# Patient Record
Sex: Male | Born: 1935 | Race: White | Hispanic: No | Marital: Married | Smoking: Never smoker
Health system: Southern US, Community
[De-identification: ages and names within clinical notes are randomized; demographics above are authoritative.]

## PROBLEM LIST (undated history)

## (undated) DIAGNOSIS — N183 Chronic kidney disease, stage 3 unspecified: Secondary | ICD-10-CM

## (undated) DIAGNOSIS — I10 Essential (primary) hypertension: Secondary | ICD-10-CM

## (undated) DIAGNOSIS — M1990 Unspecified osteoarthritis, unspecified site: Secondary | ICD-10-CM

## (undated) DIAGNOSIS — E785 Hyperlipidemia, unspecified: Secondary | ICD-10-CM

## (undated) DIAGNOSIS — E039 Hypothyroidism, unspecified: Secondary | ICD-10-CM

## (undated) DIAGNOSIS — I350 Nonrheumatic aortic (valve) stenosis: Secondary | ICD-10-CM

## (undated) DIAGNOSIS — C801 Malignant (primary) neoplasm, unspecified: Secondary | ICD-10-CM

## (undated) DIAGNOSIS — G4730 Sleep apnea, unspecified: Secondary | ICD-10-CM

## (undated) HISTORY — PX: JOINT REPLACEMENT: SHX530

## (undated) HISTORY — PX: LUMBAR LAMINECTOMY: SHX95

---
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
## 2004-09-01 HISTORY — PX: EYE SURGERY: SHX253

## 2010-09-01 DIAGNOSIS — C801 Malignant (primary) neoplasm, unspecified: Secondary | ICD-10-CM

## 2010-09-01 HISTORY — DX: Malignant (primary) neoplasm, unspecified: C80.1

## 2010-09-01 HISTORY — PX: HERNIA REPAIR: SHX51

## 2016-09-01 DIAGNOSIS — M1990 Unspecified osteoarthritis, unspecified site: Secondary | ICD-10-CM

## 2016-09-01 HISTORY — DX: Unspecified osteoarthritis, unspecified site: M19.90

## 2017-06-10 ENCOUNTER — Encounter
Admission: RE | Admit: 2017-06-10 | Discharge: 2017-06-10 | Disposition: A | Discharge status: Home / Self Care | Payer: Medicare Other | Source: Ambulatory Visit | Attending: Orthopedic Surgery | Admitting: Orthopedic Surgery

## 2017-06-10 DIAGNOSIS — R9431 Abnormal electrocardiogram [ECG] [EKG]: Secondary | ICD-10-CM | POA: Insufficient documentation

## 2017-06-10 DIAGNOSIS — I10 Essential (primary) hypertension: Secondary | ICD-10-CM | POA: Diagnosis not present

## 2017-06-10 DIAGNOSIS — Z01818 Encounter for other preprocedural examination: Secondary | ICD-10-CM | POA: Insufficient documentation

## 2017-06-10 HISTORY — DX: Essential (primary) hypertension: I10

## 2017-06-10 HISTORY — DX: Unspecified osteoarthritis, unspecified site: M19.90

## 2017-06-10 HISTORY — DX: Hypothyroidism, unspecified: E03.9

## 2017-06-10 HISTORY — DX: Sleep apnea, unspecified: G47.30

## 2017-06-10 HISTORY — DX: Malignant (primary) neoplasm, unspecified: C80.1

## 2017-06-10 LAB — SURGICAL PCR SCREEN
MRSA, PCR: NEGATIVE
STAPHYLOCOCCUS AUREUS: NEGATIVE

## 2017-06-10 LAB — COMPREHENSIVE METABOLIC PANEL
ALT: 27 U/L (ref 17–63)
AST: 28 U/L (ref 15–41)
Albumin: 4.2 g/dL (ref 3.5–5.0)
Alkaline Phosphatase: 90 U/L (ref 38–126)
Anion gap: 11 (ref 5–15)
BILIRUBIN TOTAL: 0.7 mg/dL (ref 0.3–1.2)
BUN: 28 mg/dL — AB (ref 6–20)
CO2: 31 mmol/L (ref 22–32)
CREATININE: 1.44 mg/dL — AB (ref 0.61–1.24)
Calcium: 9.7 mg/dL (ref 8.9–10.3)
Chloride: 99 mmol/L — ABNORMAL LOW (ref 101–111)
GFR calc Af Amer: 50 mL/min — ABNORMAL LOW (ref >60)
GFR, EST NON AFRICAN AMERICAN: 43 mL/min — AB (ref >60)
Glucose, Bld: 98 mg/dL (ref 65–99)
Potassium: 3.5 mmol/L (ref 3.5–5.1)
Sodium: 141 mmol/L (ref 135–145)
TOTAL PROTEIN: 7.3 g/dL (ref 6.5–8.1)

## 2017-06-10 LAB — TYPE AND SCREEN
ABO/RH(D): A POS
ANTIBODY SCREEN: NEGATIVE

## 2017-06-10 LAB — CBC
HEMATOCRIT: 38 % — AB (ref 40.0–52.0)
Hemoglobin: 13.2 g/dL (ref 13.0–18.0)
MCH: 30 pg (ref 26.0–34.0)
MCHC: 34.6 g/dL (ref 32.0–36.0)
MCV: 86.5 fL (ref 80.0–100.0)
Platelets: 245 10*3/uL (ref 150–440)
RBC: 4.39 MIL/uL — ABNORMAL LOW (ref 4.40–5.90)
RDW: 14.1 % (ref 11.5–14.5)
WBC: 6.5 10*3/uL (ref 3.8–10.6)

## 2017-06-10 LAB — PROTIME-INR
INR: 0.98
PROTHROMBIN TIME: 12.9 s (ref 11.4–15.2)

## 2017-06-10 LAB — SEDIMENTATION RATE: Sed Rate: 16 mm/hr (ref 0–20)

## 2017-06-10 LAB — URINALYSIS, ROUTINE W REFLEX MICROSCOPIC
BILIRUBIN URINE: NEGATIVE
GLUCOSE, UA: NEGATIVE mg/dL
HGB URINE DIPSTICK: NEGATIVE
Ketones, ur: NEGATIVE mg/dL
Leukocytes, UA: NEGATIVE
Nitrite: NEGATIVE
PH: 6 (ref 5.0–8.0)
Protein, ur: NEGATIVE mg/dL
SPECIFIC GRAVITY, URINE: 1.012 (ref 1.005–1.030)

## 2017-06-10 LAB — APTT: aPTT: 28 seconds (ref 24–36)

## 2017-06-10 LAB — C-REACTIVE PROTEIN

## 2017-06-10 NOTE — Patient Instructions (Signed)
Your procedure is scheduled on: June 24, 2017  Report to [facility], 2nd floor  To find out your arrival time please call [phone] between 1PM - 3PM on June 23, 2017  Remember: Instructions that are not followed completely may result in serious medical risk, up to and including death, or upon the discretion of your surgeon and anesthesiologist your surgery may need to be rescheduled.     _X__ 1. Do not eat food after midnight the night before your procedure.                 No gum chewing or hard candies. You may drink clear liquids up to 2 hours                 before you are scheduled to arrive for your surgery- DO not drink clear                 liquids within 2 hours of the start of your surgery.                 Clear Liquids include:  water, apple juice without pulp, clear carbohydrate                 drink such as Clearfast of Gartorade, Black Coffee or Tea (Do not add                 anything to coffee or tea).     _X__ 2.  No Alcohol for 24 hours before or after surgery.    _      3.  Do Not Smoke or use e-cigarettes For 24 Hours Prior to Your Surgery.                 Do not use any chewable tobacco products for at least 6 hours prior to                 surgery.  ____  4.  Bring all medications with you on the day of surgery if instructed.   __X__  5.  Notify your doctor if there is any change in your medical condition      (cold, fever, infections).     Do not wear jewelry, make-up, hairpins, clips or nail polish. Do not wear lotions, powders, or perfumes. You may wear deodorant. Do not shave 48 hours prior to surgery. Men may shave face and neck. Do not bring valuables to the hospital.    [facility] is not responsible for any belongings or valuables.  Contacts, dentures or bridgework may not be worn into surgery. Leave your suitcase in the car. After surgery it may be brought to your room. For patients admitted to the hospital,  discharge time is determined by your treatment team.   Patients discharged the day of surgery will not be allowed to drive home.   Please read over the following fact sheets that you were given:   [place]   ____ Take these medicines the morning of surgery with A SIP OF WATER:    1.Amlodipine  2. Synthroid  3.   4.  5.  6.  ____ Fleet Enema (as directed)   __X__ Use CHG Soap as directed  ____ Use inhalers on the day of surgery  ____ Stop metformin 2 days prior to surgery    ____ Take 1/2 of usual insulin dose the night before surgery. No insulin the morning  of surgery.   __X__ Stop Coumadin/Plavix/aspirin on June 17, 2017  ___X_ Stop Anti-inflammatories (ibuprofen, motrin, aleve, advil, mobic) on June 17, 2017   __X__ Stop supplements until after surgery.  Stop Fish oil also on June 17, 2017  ____ Bring C-Pap to the hospital.   Continue taking Hydrochlorothiazide, potassium, fiber and multivitamins up until day of surgery but do not take on the day of surgery.

## 2017-06-11 LAB — URINE CULTURE
CULTURE: NO GROWTH
SPECIAL REQUESTS: NORMAL

## 2017-06-11 NOTE — Pre-Procedure Instructions (Signed)
EKG REVIEWED BY DR Rosey Bath AND OK TO PROCEED WITH SURGERY

## 2017-06-23 MED ORDER — CEFAZOLIN SODIUM-DEXTROSE 2-4 GM/100ML-% IV SOLN
2.0000 g | INTRAVENOUS | Status: AC
  Administered 2017-06-24: 2 g via INTRAVENOUS

## 2017-06-24 ENCOUNTER — Encounter: Payer: Self-pay | Admitting: Orthopedic Surgery

## 2017-06-24 ENCOUNTER — Encounter
Admission: RE | Disposition: A | Discharge status: Home Health | Payer: Self-pay | Source: Ambulatory Visit | Attending: Orthopedic Surgery

## 2017-06-24 ENCOUNTER — Inpatient Hospital Stay: Payer: Medicare Other | Admitting: Certified Registered Nurse Anesthetist

## 2017-06-24 ENCOUNTER — Inpatient Hospital Stay: Payer: Medicare Other

## 2017-06-24 ENCOUNTER — Inpatient Hospital Stay
Admission: RE | Admit: 2017-06-24 | Discharge: 2017-06-26 | DRG: 470 | Disposition: A | Discharge status: Home Health | Payer: Medicare Other | Source: Ambulatory Visit | Attending: Orthopedic Surgery | Admitting: Orthopedic Surgery

## 2017-06-24 DIAGNOSIS — Z96649 Presence of unspecified artificial hip joint: Secondary | ICD-10-CM

## 2017-06-24 DIAGNOSIS — Z23 Encounter for immunization: Secondary | ICD-10-CM

## 2017-06-24 DIAGNOSIS — E785 Hyperlipidemia, unspecified: Secondary | ICD-10-CM | POA: Diagnosis present

## 2017-06-24 DIAGNOSIS — I350 Nonrheumatic aortic (valve) stenosis: Secondary | ICD-10-CM | POA: Diagnosis present

## 2017-06-24 DIAGNOSIS — M19042 Primary osteoarthritis, left hand: Secondary | ICD-10-CM | POA: Diagnosis present

## 2017-06-24 DIAGNOSIS — E039 Hypothyroidism, unspecified: Secondary | ICD-10-CM | POA: Diagnosis present

## 2017-06-24 DIAGNOSIS — I129 Hypertensive chronic kidney disease with stage 1 through stage 4 chronic kidney disease, or unspecified chronic kidney disease: Secondary | ICD-10-CM | POA: Diagnosis present

## 2017-06-24 DIAGNOSIS — Z85820 Personal history of malignant melanoma of skin: Secondary | ICD-10-CM | POA: Diagnosis not present

## 2017-06-24 DIAGNOSIS — M25551 Pain in right hip: Secondary | ICD-10-CM | POA: Diagnosis present

## 2017-06-24 DIAGNOSIS — M1611 Unilateral primary osteoarthritis, right hip: Secondary | ICD-10-CM | POA: Diagnosis present

## 2017-06-24 DIAGNOSIS — Z791 Long term (current) use of non-steroidal anti-inflammatories (NSAID): Secondary | ICD-10-CM | POA: Diagnosis not present

## 2017-06-24 DIAGNOSIS — M19041 Primary osteoarthritis, right hand: Secondary | ICD-10-CM | POA: Diagnosis present

## 2017-06-24 DIAGNOSIS — N183 Chronic kidney disease, stage 3 (moderate): Secondary | ICD-10-CM | POA: Diagnosis present

## 2017-06-24 DIAGNOSIS — Z7982 Long term (current) use of aspirin: Secondary | ICD-10-CM

## 2017-06-24 DIAGNOSIS — G4730 Sleep apnea, unspecified: Secondary | ICD-10-CM | POA: Diagnosis present

## 2017-06-24 DIAGNOSIS — Z79899 Other long term (current) drug therapy: Secondary | ICD-10-CM | POA: Diagnosis not present

## 2017-06-24 HISTORY — PX: TOTAL HIP ARTHROPLASTY: SHX124

## 2017-06-24 LAB — ABO/RH: ABO/RH(D): A POS

## 2017-06-24 SURGERY — ARTHROPLASTY, HIP, TOTAL,POSTERIOR APPROACH
Anesthesia: Spinal | Site: Hip | Laterality: Right | Wound class: Clean

## 2017-06-24 MED ORDER — DEXTROSE 5 % IV SOLN
2.0000 g | Freq: Four times a day (QID) | INTRAVENOUS | Status: AC
  Administered 2017-06-24 – 2017-06-25 (×4): 2 g via INTRAVENOUS
  Filled 2017-06-24 (×4): qty 20

## 2017-06-24 MED ORDER — ACETAMINOPHEN 650 MG RE SUPP: 650.0000 mg | RECTAL | Status: DC | PRN

## 2017-06-24 MED ORDER — LACTATED RINGERS IV SOLN
INTRAVENOUS | Status: DC
  Administered 2017-06-24 (×2): via INTRAVENOUS

## 2017-06-24 MED ORDER — ONDANSETRON HCL 4 MG/2ML IJ SOLN: 4.0000 mg | Freq: Once | INTRAMUSCULAR | Status: DC | PRN

## 2017-06-24 MED ORDER — CEFAZOLIN SODIUM-DEXTROSE 2-4 GM/100ML-% IV SOLN
INTRAVENOUS | Status: AC
  Filled 2017-06-24: qty 100

## 2017-06-24 MED ORDER — TRAMADOL HCL 50 MG PO TABS
50.0000 mg | ORAL_TABLET | ORAL | Status: DC | PRN
  Administered 2017-06-24: 50 mg via ORAL
  Filled 2017-06-24: qty 1

## 2017-06-24 MED ORDER — VITAMIN B-12 1000 MCG PO TABS
1000.0000 ug | ORAL_TABLET | Freq: Every day | ORAL | Status: DC
  Administered 2017-06-24 – 2017-06-26 (×3): 1000 ug via ORAL
  Filled 2017-06-24 (×3): qty 1

## 2017-06-24 MED ORDER — DEXAMETHASONE SODIUM PHOSPHATE 4 MG/ML IJ SOLN
INTRAMUSCULAR | Status: DC | PRN
  Administered 2017-06-24: 5 mg via INTRAVENOUS

## 2017-06-24 MED ORDER — GLYCOPYRROLATE 0.2 MG/ML IJ SOLN
INTRAMUSCULAR | Status: DC | PRN
  Administered 2017-06-24: 0.2 mg via INTRAVENOUS

## 2017-06-24 MED ORDER — PROPOFOL 10 MG/ML IV BOLUS
INTRAVENOUS | Status: AC
  Filled 2017-06-24: qty 20

## 2017-06-24 MED ORDER — FLEET ENEMA 7-19 GM/118ML RE ENEM: 1.0000 | ENEMA | Freq: Once | RECTAL | Status: DC | PRN

## 2017-06-24 MED ORDER — ADULT MULTIVITAMIN W/MINERALS CH
1.0000 | ORAL_TABLET | Freq: Every day | ORAL | Status: DC
  Administered 2017-06-24 – 2017-06-26 (×3): 1 via ORAL
  Filled 2017-06-24 (×3): qty 1

## 2017-06-24 MED ORDER — NEOMYCIN-POLYMYXIN B GU 40-200000 IR SOLN
Status: AC
  Filled 2017-06-24: qty 20

## 2017-06-24 MED ORDER — MAGNESIUM HYDROXIDE 400 MG/5ML PO SUSP
30.0000 mL | Freq: Every day | ORAL | Status: DC | PRN
  Administered 2017-06-25: 30 mL via ORAL
  Filled 2017-06-24: qty 30

## 2017-06-24 MED ORDER — POTASSIUM CHLORIDE ER 10 MEQ PO TBCR
10.0000 meq | EXTENDED_RELEASE_TABLET | Freq: Every day | ORAL | Status: DC
  Administered 2017-06-24 – 2017-06-26 (×3): 10 meq via ORAL
  Filled 2017-06-24 (×4): qty 1

## 2017-06-24 MED ORDER — GLYCOPYRROLATE 0.2 MG/ML IJ SOLN
INTRAMUSCULAR | Status: AC
  Filled 2017-06-24: qty 1

## 2017-06-24 MED ORDER — MORPHINE SULFATE (PF) 2 MG/ML IV SOLN: 2.0000 mg | INTRAVENOUS | Status: DC | PRN

## 2017-06-24 MED ORDER — ACETAMINOPHEN 10 MG/ML IV SOLN
1000.0000 mg | Freq: Four times a day (QID) | INTRAVENOUS | Status: AC
  Administered 2017-06-24 (×3): 1000 mg via INTRAVENOUS
  Filled 2017-06-24 (×4): qty 100

## 2017-06-24 MED ORDER — TRANEXAMIC ACID 1000 MG/10ML IV SOLN
1000.0000 mg | Freq: Once | INTRAVENOUS | Status: AC
  Administered 2017-06-24: 1000 mg via INTRAVENOUS
  Filled 2017-06-24: qty 10

## 2017-06-24 MED ORDER — BISACODYL 10 MG RE SUPP
10.0000 mg | Freq: Every day | RECTAL | Status: DC | PRN
  Administered 2017-06-26: 10 mg via RECTAL
  Filled 2017-06-24: qty 1

## 2017-06-24 MED ORDER — PROPOFOL 500 MG/50ML IV EMUL
INTRAVENOUS | Status: DC | PRN
  Administered 2017-06-24: 50 ug/kg/min via INTRAVENOUS

## 2017-06-24 MED ORDER — DIPHENHYDRAMINE HCL 12.5 MG/5ML PO ELIX: 12.5000 mg | ORAL_SOLUTION | ORAL | Status: DC | PRN

## 2017-06-24 MED ORDER — ACETAMINOPHEN 325 MG PO TABS: 650.0000 mg | ORAL_TABLET | ORAL | Status: DC | PRN

## 2017-06-24 MED ORDER — OXYCODONE HCL 5 MG PO TABS
10.0000 mg | ORAL_TABLET | ORAL | Status: DC | PRN
  Administered 2017-06-25: 10 mg via ORAL
  Filled 2017-06-24: qty 2

## 2017-06-24 MED ORDER — CHLORHEXIDINE GLUCONATE 4 % EX LIQD: 60.0000 mL | Freq: Once | CUTANEOUS | Status: DC

## 2017-06-24 MED ORDER — OMEGA-3-ACID ETHYL ESTERS 1 G PO CAPS
1.0000 g | ORAL_CAPSULE | Freq: Every day | ORAL | Status: DC
  Administered 2017-06-24 – 2017-06-26 (×3): 1 g via ORAL
  Filled 2017-06-24 (×3): qty 1

## 2017-06-24 MED ORDER — ONDANSETRON HCL 4 MG PO TABS: 4.0000 mg | ORAL_TABLET | Freq: Four times a day (QID) | ORAL | Status: DC | PRN

## 2017-06-24 MED ORDER — BUPIVACAINE HCL (PF) 0.5 % IJ SOLN
INTRAMUSCULAR | Status: DC | PRN
  Administered 2017-06-24: 3 mL

## 2017-06-24 MED ORDER — SENNOSIDES-DOCUSATE SODIUM 8.6-50 MG PO TABS
1.0000 | ORAL_TABLET | Freq: Two times a day (BID) | ORAL | Status: DC
  Administered 2017-06-24 – 2017-06-26 (×4): 1 via ORAL
  Filled 2017-06-24 (×4): qty 1

## 2017-06-24 MED ORDER — FAMOTIDINE 20 MG PO TABS
20.0000 mg | ORAL_TABLET | Freq: Once | ORAL | Status: AC
  Administered 2017-06-24: 20 mg via ORAL

## 2017-06-24 MED ORDER — CALCIUM POLYCARBOPHIL 625 MG PO TABS
625.0000 mg | ORAL_TABLET | Freq: Every day | ORAL | Status: DC
  Administered 2017-06-24 – 2017-06-26 (×3): 625 mg via ORAL
  Filled 2017-06-24 (×3): qty 1

## 2017-06-24 MED ORDER — PANTOPRAZOLE SODIUM 40 MG PO TBEC
40.0000 mg | DELAYED_RELEASE_TABLET | Freq: Two times a day (BID) | ORAL | Status: DC
  Administered 2017-06-24 – 2017-06-26 (×4): 40 mg via ORAL
  Filled 2017-06-24 (×4): qty 1

## 2017-06-24 MED ORDER — INFLUENZA VAC SPLIT HIGH-DOSE 0.5 ML IM SUSY
0.5000 mL | PREFILLED_SYRINGE | INTRAMUSCULAR | Status: AC
  Administered 2017-06-25: 0.5 mL via INTRAMUSCULAR
  Filled 2017-06-24 (×2): qty 0.5

## 2017-06-24 MED ORDER — SODIUM CHLORIDE 0.9 % IV SOLN
INTRAVENOUS | Status: DC | PRN
  Administered 2017-06-24: 20 ug/min via INTRAVENOUS

## 2017-06-24 MED ORDER — BUPIVACAINE HCL (PF) 0.5 % IJ SOLN
INTRAMUSCULAR | Status: AC
  Filled 2017-06-24: qty 10

## 2017-06-24 MED ORDER — FAMOTIDINE 20 MG PO TABS
ORAL_TABLET | ORAL | Status: AC
  Administered 2017-06-24: 20 mg via ORAL
  Filled 2017-06-24: qty 1

## 2017-06-24 MED ORDER — ONDANSETRON HCL 4 MG/2ML IJ SOLN: 4.0000 mg | Freq: Four times a day (QID) | INTRAMUSCULAR | Status: DC | PRN

## 2017-06-24 MED ORDER — ACETAMINOPHEN 10 MG/ML IV SOLN
INTRAVENOUS | Status: AC
  Filled 2017-06-24: qty 100

## 2017-06-24 MED ORDER — PROPOFOL 10 MG/ML IV BOLUS
INTRAVENOUS | Status: DC | PRN
  Administered 2017-06-24: 8 mg via INTRAVENOUS
  Administered 2017-06-24: 40 mg via INTRAVENOUS

## 2017-06-24 MED ORDER — SODIUM CHLORIDE 0.9 % IV SOLN
INTRAVENOUS | Status: DC
  Administered 2017-06-24 – 2017-06-25 (×3): via INTRAVENOUS

## 2017-06-24 MED ORDER — ALUM & MAG HYDROXIDE-SIMETH 200-200-20 MG/5ML PO SUSP: 30.0000 mL | ORAL | Status: DC | PRN

## 2017-06-24 MED ORDER — OXYCODONE HCL 5 MG PO TABS
5.0000 mg | ORAL_TABLET | ORAL | Status: DC | PRN
  Administered 2017-06-24 – 2017-06-26 (×8): 5 mg via ORAL
  Filled 2017-06-24 (×9): qty 1

## 2017-06-24 MED ORDER — PROPOFOL 500 MG/50ML IV EMUL
INTRAVENOUS | Status: AC
  Filled 2017-06-24: qty 50

## 2017-06-24 MED ORDER — FENTANYL CITRATE (PF) 100 MCG/2ML IJ SOLN: 25.0000 ug | INTRAMUSCULAR | Status: DC | PRN

## 2017-06-24 MED ORDER — FENTANYL CITRATE (PF) 100 MCG/2ML IJ SOLN
INTRAMUSCULAR | Status: DC | PRN
  Administered 2017-06-24: 100 ug via INTRAVENOUS

## 2017-06-24 MED ORDER — FERROUS SULFATE 325 (65 FE) MG PO TABS
325.0000 mg | ORAL_TABLET | Freq: Two times a day (BID) | ORAL | Status: DC
  Administered 2017-06-24 – 2017-06-26 (×4): 325 mg via ORAL
  Filled 2017-06-24 (×4): qty 1

## 2017-06-24 MED ORDER — ENOXAPARIN SODIUM 30 MG/0.3ML ~~LOC~~ SOLN
30.0000 mg | Freq: Two times a day (BID) | SUBCUTANEOUS | Status: DC
  Administered 2017-06-25 – 2017-06-26 (×3): 30 mg via SUBCUTANEOUS
  Filled 2017-06-24 (×3): qty 0.3

## 2017-06-24 MED ORDER — FENTANYL CITRATE (PF) 100 MCG/2ML IJ SOLN
INTRAMUSCULAR | Status: AC
  Filled 2017-06-24: qty 2

## 2017-06-24 MED ORDER — PHENYLEPHRINE HCL 10 MG/ML IJ SOLN
INTRAMUSCULAR | Status: AC
  Filled 2017-06-24: qty 1

## 2017-06-24 MED ORDER — MENTHOL 3 MG MT LOZG: 1.0000 | LOZENGE | OROMUCOSAL | Status: DC | PRN

## 2017-06-24 MED ORDER — LEVOTHYROXINE SODIUM 50 MCG PO TABS
50.0000 ug | ORAL_TABLET | Freq: Every day | ORAL | Status: DC
  Administered 2017-06-25 – 2017-06-26 (×2): 50 ug via ORAL
  Filled 2017-06-24 (×2): qty 1

## 2017-06-24 MED ORDER — METOCLOPRAMIDE HCL 10 MG PO TABS
10.0000 mg | ORAL_TABLET | Freq: Three times a day (TID) | ORAL | Status: AC
  Administered 2017-06-24 – 2017-06-26 (×8): 10 mg via ORAL
  Filled 2017-06-24 (×8): qty 1

## 2017-06-24 MED ORDER — AMLODIPINE BESYLATE 10 MG PO TABS
10.0000 mg | ORAL_TABLET | Freq: Every day | ORAL | Status: DC
  Administered 2017-06-25 – 2017-06-26 (×2): 10 mg via ORAL
  Filled 2017-06-24 (×2): qty 1

## 2017-06-24 MED ORDER — DEXAMETHASONE SODIUM PHOSPHATE 10 MG/ML IJ SOLN
INTRAMUSCULAR | Status: AC
  Filled 2017-06-24: qty 1

## 2017-06-24 MED ORDER — HYDROCHLOROTHIAZIDE 25 MG PO TABS
25.0000 mg | ORAL_TABLET | Freq: Every day | ORAL | Status: DC
  Administered 2017-06-24 – 2017-06-26 (×3): 25 mg via ORAL
  Filled 2017-06-24 (×3): qty 1

## 2017-06-24 MED ORDER — ACETAMINOPHEN 10 MG/ML IV SOLN
INTRAVENOUS | Status: DC | PRN
  Administered 2017-06-24: 1000 mg via INTRAVENOUS

## 2017-06-24 MED ORDER — TRANEXAMIC ACID 1000 MG/10ML IV SOLN
1000.0000 mg | INTRAVENOUS | Status: AC
  Administered 2017-06-24: 1000 mg via INTRAVENOUS
  Filled 2017-06-24: qty 10

## 2017-06-24 MED ORDER — PHENOL 1.4 % MT LIQD: 1.0000 | OROMUCOSAL | Status: DC | PRN

## 2017-06-24 MED ORDER — NEOMYCIN-POLYMYXIN B GU 40-200000 IR SOLN
Status: DC | PRN
  Administered 2017-06-24: 16 mL

## 2017-06-24 SURGICAL SUPPLY — 57 items
BLADE CLIPPER SURG (BLADE) ×3 IMPLANT
BLADE DRUM FLTD (BLADE) ×3 IMPLANT
BLADE SAW 1 (BLADE) ×3 IMPLANT
CANISTER SUCT 1200ML W/VALVE (MISCELLANEOUS) ×3 IMPLANT
CANISTER SUCT 3000ML PPV (MISCELLANEOUS) ×6 IMPLANT
CAPT HIP TOTAL 2 ×3 IMPLANT
CARTRIDGE OIL MAESTRO DRILL (MISCELLANEOUS) ×1 IMPLANT
CATH FOL LEG HOLDER (MISCELLANEOUS) ×3 IMPLANT
CATH TRAY METER 16FR LF (MISCELLANEOUS) ×3 IMPLANT
DIFFUSER MAESTRO (MISCELLANEOUS) ×3 IMPLANT
DRAPE INCISE IOBAN 66X60 STRL (DRAPES) ×3 IMPLANT
DRAPE SHEET LG 3/4 BI-LAMINATE (DRAPES) ×3 IMPLANT
DRSG DERMACEA 8X12 NADH (GAUZE/BANDAGES/DRESSINGS) ×3 IMPLANT
DRSG OPSITE POSTOP 4X12 (GAUZE/BANDAGES/DRESSINGS) ×3 IMPLANT
DRSG OPSITE POSTOP 4X14 (GAUZE/BANDAGES/DRESSINGS) IMPLANT
DRSG TEGADERM 2-3/8X2-3/4 SM (GAUZE/BANDAGES/DRESSINGS) ×3 IMPLANT
DRSG TEGADERM 4X4.75 (GAUZE/BANDAGES/DRESSINGS) ×3 IMPLANT
DURAPREP 26ML APPLICATOR (WOUND CARE) ×6 IMPLANT
ELECT BLADE 6.5 EXT (BLADE) ×3 IMPLANT
ELECT CAUTERY BLADE 6.4 (BLADE) ×3 IMPLANT
EVACUATOR 1/8 PVC DRAIN (DRAIN) ×3 IMPLANT
GLOVE BIOGEL M STRL SZ7.5 (GLOVE) ×6 IMPLANT
GLOVE BIOGEL PI IND STRL 7.0 (GLOVE) ×4 IMPLANT
GLOVE BIOGEL PI IND STRL 9 (GLOVE) ×2 IMPLANT
GLOVE BIOGEL PI INDICATOR 7.0 (GLOVE) ×8
GLOVE BIOGEL PI INDICATOR 9 (GLOVE) ×4
GLOVE INDICATOR 7.0 STRL GRN (GLOVE) ×6 IMPLANT
GLOVE INDICATOR 8.0 STRL GRN (GLOVE) ×3 IMPLANT
GLOVE SURG SYN 9.0  PF PI (GLOVE) ×2
GLOVE SURG SYN 9.0 PF PI (GLOVE) ×1 IMPLANT
GOWN STRL REUS W/ TWL LRG LVL3 (GOWN DISPOSABLE) ×3 IMPLANT
GOWN STRL REUS W/TWL 2XL LVL3 (GOWN DISPOSABLE) ×3 IMPLANT
GOWN STRL REUS W/TWL LRG LVL3 (GOWN DISPOSABLE) ×6
HOOD PEEL AWAY FLYTE STAYCOOL (MISCELLANEOUS) ×6 IMPLANT
KIT RM TURNOVER STRD PROC AR (KITS) ×3 IMPLANT
NDL SAFETY 18GX1.5 (NEEDLE) ×3 IMPLANT
NS IRRIG 500ML POUR BTL (IV SOLUTION) ×3 IMPLANT
OIL CARTRIDGE MAESTRO DRILL (MISCELLANEOUS) ×3
PACK HIP PROSTHESIS (MISCELLANEOUS) ×3 IMPLANT
PIN STEIN THRED 5/32 (Pin) ×3 IMPLANT
PULSAVAC PLUS IRRIG FAN TIP (DISPOSABLE) ×3
SOL .9 NS 3000ML IRR  AL (IV SOLUTION) ×2
SOL .9 NS 3000ML IRR UROMATIC (IV SOLUTION) ×1 IMPLANT
SOL PREP PVP 2OZ (MISCELLANEOUS) ×3
SOLUTION PREP PVP 2OZ (MISCELLANEOUS) ×1 IMPLANT
SPONGE DRAIN TRACH 4X4 STRL 2S (GAUZE/BANDAGES/DRESSINGS) ×3 IMPLANT
STAPLER SKIN PROX 35W (STAPLE) ×3 IMPLANT
SUT ETHIBOND #5 BRAIDED 30INL (SUTURE) ×3 IMPLANT
SUT VIC AB 0 CT1 36 (SUTURE) ×3 IMPLANT
SUT VIC AB 1 CT1 36 (SUTURE) ×6 IMPLANT
SUT VIC AB 2-0 CT1 27 (SUTURE) ×2
SUT VIC AB 2-0 CT1 TAPERPNT 27 (SUTURE) ×1 IMPLANT
SYR 20CC LL (SYRINGE) ×3 IMPLANT
TAPE ADH 3 LX (MISCELLANEOUS) ×3 IMPLANT
TAPE TRANSPORE STRL 2 31045 (GAUZE/BANDAGES/DRESSINGS) ×3 IMPLANT
TIP FAN IRRIG PULSAVAC PLUS (DISPOSABLE) ×1 IMPLANT
TOWEL OR 17X26 4PK STRL BLUE (TOWEL DISPOSABLE) ×3 IMPLANT

## 2017-06-24 NOTE — Anesthesia Preprocedure Evaluation (Signed)
Anesthesia Evaluation  Patient identified by MRN, date of birth, ID band Patient awake    Reviewed: Allergy & Precautions, H&P , NPO status , Patient's Chart, lab work & pertinent test results, reviewed documented beta blocker date and time   History of Anesthesia Complications Negative for: history of anesthetic complications  Airway Mallampati: III  TM Distance: >3 FB Neck ROM: full    Dental  (+) Dental Advidsory Given, Caps, Teeth Intact   Pulmonary neg shortness of breath, sleep apnea , neg COPD, neg recent URI,           Cardiovascular Exercise Tolerance: Good hypertension, (-) angina(-) CAD, (-) Past MI, (-) Cardiac Stents and (-) CABG (-) dysrhythmias + Valvular Problems/Murmurs      Neuro/Psych negative neurological ROS  negative psych ROS   GI/Hepatic negative GI ROS, Neg liver ROS,   Endo/Other  neg diabetesHypothyroidism   Renal/GU negative Renal ROS  negative genitourinary   Musculoskeletal   Abdominal   Peds  Hematology negative hematology ROS (+)   Anesthesia Other Findings Past Medical History: 2018: Arthritis     Comment:  left hip and fingers 2012: Cancer ([place])     Comment:  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 No date: Hypertension No date: Hypothyroidism No date: Sleep apnea     Comment:  no cpap since he has not had a study   Reproductive/Obstetrics negative OB ROS                             Anesthesia Physical Anesthesia Plan  ASA: II  Anesthesia Plan: Spinal   Post-op Pain Management:    Induction:   PONV Risk Score and Plan: 2 and Ondansetron and Dexamethasone  Airway Management Planned: Simple Face Mask  Additional Equipment:   Intra-op Plan:   Post-operative Plan:   Informed Consent: I have reviewed the patients History and Physical, chart, labs and discussed the procedure including the risks, benefits and alternatives for  the proposed anesthesia with the patient or authorized representative who has indicated his/her understanding and acceptance.   Dental Advisory Given  Plan Discussed with: Anesthesiologist, CRNA and Surgeon  Anesthesia Plan Comments:         Anesthesia Quick Evaluation

## 2017-06-24 NOTE — Evaluation (Signed)
Physical Therapy Evaluation Patient Details Name: Peter Grant MRN: [id number] DOB: 1930/12/03 Today's Date: 06/24/2017   History of Present Illness  Pt is an 83 y.o. M s/p R THA with posterior hip precautions on 06/24/17.  Clinical Impression  Prior to hospital admission, pt was independent with all ADLs; active with golf and housework; driving; intermittent SPC use.  Pt lives at home with wife.  Currently pt is min assist for bed mobility; min guard for transfers and gait; generalized strength and balance deficits.  Pt would benefit from skilled PT to address noted impairments and functional limitations (see below for any additional details).  Upon hospital discharge, recommend pt discharge to [place].     Follow Up Recommendations Home health PT    Equipment Recommendations  Rolling walker with 5" wheels    Recommendations for Other Services       Precautions / Restrictions Precautions Precautions: Posterior Hip Precaution Booklet Issued: Yes (comment) Restrictions Weight Bearing Restrictions: Yes RLE Weight Bearing: Weight bearing as tolerated      Mobility  Bed Mobility Overal bed mobility: Needs Assistance Bed Mobility: Supine to Sit     Supine to sit: Min assist     General bed mobility comments: Min assist for LE movement; vc's for sequencing  Transfers Overall transfer level: Needs assistance Equipment used: Rolling walker (2 wheeled) Transfers: Sit to/from Stand Sit to Stand: Min guard         General transfer comment: min guard for safety; vc's for extending RLE to maintian hip precautions, pushing off of bed with arms into standing  Ambulation/Gait Ambulation/Gait assistance: Min guard Ambulation Distance (Feet): 4 Feet Assistive device: Rolling walker (2 wheeled)       General Gait Details: decreased gait speed; short steps, decreased stride length bilaterally  Stairs            Wheelchair Mobility    Modified Rankin (Stroke Patients  Only)       Balance Overall balance assessment: Needs assistance Sitting-balance support: Feet supported Sitting balance-Leahy Scale: Fair Sitting balance - Comments: able to sit at EOB, hands in lap, feet supported without LOB   Standing balance support: Bilateral upper extremity supported Standing balance-Leahy Scale: Fair Standing balance comment: RW for BUE support in standing, gait                             Pertinent Vitals/Pain Pain Assessment: 0-10 Pain Score: 3  Pain Location: R hip Pain Descriptors / Indicators: Discomfort;Guarding;Sore Pain Intervention(s): Limited activity within patient's tolerance;Monitored during session;Ice applied    Home Living Family/patient expects to be discharged to:: Private residence Living Arrangements: Spouse/significant other Available Help at Discharge: Family Type of Home: House Home Access: Stairs to enter Entrance Stairs-Rails: Left Entrance Stairs-Number of Steps: 3 Home Layout: Two level (needs are all on first floor) Home Equipment: Cane - single point;Shower seat - built in;Grab bars - toilet;Grab bars - tub/shower      Prior Function Level of Independence: Independent         Comments: Independent with all ADLs; driving; intermittent SPC use, pt reports wife "yells" at him to use [facility] because it appears he limps while walking; active with golf, yard work and other projects around home     Hand Dominance        Extremity/Trunk Assessment   Upper Extremity Assessment Upper Extremity Assessment: Overall WFL for tasks assessed    Lower Extremity Assessment Lower Extremity  Assessment: Generalized weakness (LLE WFL; R hip flexion, knee flexion, extension, DF/PF at least 3/5)    Cervical / Trunk Assessment Cervical / Trunk Assessment: Normal  Communication   Communication: No difficulties  Cognition Arousal/Alertness: Awake/alert Behavior During Therapy: WFL for tasks assessed/performed Overall  Cognitive Status: Within Functional Limits for tasks assessed                                        General Comments      Exercises Total Joint Exercises Ankle Circles/Pumps: AROM;Both;10 reps;Supine Quad Sets: AROM;Strengthening;Both;10 reps;Supine Gluteal Sets: Strengthening;Both;10 reps;Supine Towel Squeeze: Strengthening;AROM;Both;10 reps;Supine Short Arc Quad: AROM;Both;10 reps;Supine Heel Slides: AROM;Left;AAROM;Right;10 reps;Supine Hip ABduction/ADduction: AROM;Left;AAROM;Right;10 reps;Supine   Assessment/Plan    PT Assessment Patient needs continued PT services  PT Problem List Decreased strength;Decreased range of motion;Decreased activity tolerance;Decreased balance;Decreased mobility;Pain       PT Treatment Interventions DME instruction;Gait training;Stair training;Functional mobility training;Therapeutic activities;Therapeutic exercise;Balance training;Patient/family education    PT Goals (Current goals can be found in the Care Plan section)  Acute Rehab PT Goals Patient Stated Goal: to go home PT Goal Formulation: With patient Time For Goal Achievement: 07/08/17 Potential to Achieve Goals: Good    Frequency BID   Barriers to discharge        Co-evaluation               AM-PAC PT "6 Clicks" Daily Activity  Outcome Measure Difficulty turning over in bed (including adjusting bedclothes, sheets and blankets)?: A Little Difficulty moving from lying on back to sitting on the side of the bed? : Unable Difficulty sitting down on and standing up from a chair with arms (e.g., wheelchair, bedside commode, etc,.)?: A Lot Help needed moving to and from a bed to chair (including a wheelchair)?: A Little Help needed walking in hospital room?: A Little Help needed climbing 3-5 steps with a railing? : A Lot 6 Click Score: 14    End of Session Equipment Utilized During Treatment: Gait belt Activity Tolerance: Patient tolerated treatment  well Patient left: in chair;with call bell/phone within reach;with chair alarm set;with SCD's reapplied (heels elevated, pillow between legs) Nurse Communication: Mobility status PT Visit Diagnosis: Unsteadiness on feet (R26.81);Other abnormalities of gait and mobility (R26.89);Muscle weakness (generalized) (M62.81);Pain Pain - Right/Left: Right Pain - part of body: Hip    Time: 7846-9629 PT Time Calculation (min) (ACUTE ONLY): 34 min   Charges:         PT G CodesWetzel Bjornstad, SPT 06/24/2017, 4:08 PM

## 2017-06-24 NOTE — Anesthesia Post-op Follow-up Note (Signed)
Anesthesia QCDR form completed.        

## 2017-06-24 NOTE — Anesthesia Procedure Notes (Signed)
Spinal  Patient location during procedure: OR Start time: 06/24/2017 7:28 AM End time: 06/24/2017 7:37 AM Staffing Anesthesiologist: Martha Clan Resident/CRNA: Rosaria Ferries, Dekendrick Uzelac Performed: anesthesiologist  Preanesthetic Checklist Completed: patient identified, site marked, surgical consent, pre-op evaluation, timeout performed, IV checked, risks and benefits discussed and monitors and equipment checked Spinal Block Patient position: sitting Prep: ChloraPrep Patient monitoring: heart rate, continuous pulse ox, blood pressure and cardiac monitor Approach: midline Location: L3-4 Injection technique: single-shot Needle Needle type: Whitacre and Introducer  Needle gauge: 24 G Needle length: 9 cm Assessment Sensory level: T10 Additional Notes Negative paresthesia. Negative blood return. Positive free-flowing CSF. Expiration date of kit checked and confirmed. Patient tolerated procedure well, without complications.

## 2017-06-24 NOTE — Transfer of Care (Signed)
Immediate Anesthesia Transfer of Care Note  Patient: Peter Grant  Procedure(s) Performed: TOTAL HIP ARTHROPLASTY (Right Hip)  Patient Location: PACU  Anesthesia Type:Spinal  Level of Consciousness: awake and patient cooperative  Airway & Oxygen Therapy: Patient Spontanous Breathing and Patient connected to nasal cannula oxygen  Post-op Assessment: Report given to RN and Post -op Vital signs reviewed and stable  Post vital signs: Reviewed and stable  Last Vitals:  Vitals:   06/24/17 0618  BP: (!) 158/79  Pulse: 88  Resp: 15  Temp: 36.5 C  SpO2: 99%    Last Pain:  Vitals:   06/24/17 0618  TempSrc: Tympanic  PainSc: 6          Complications: No apparent anesthesia complications

## 2017-06-24 NOTE — Discharge Instructions (Signed)
Instructions after Total Hip Replacement ° ° °  Salvatrice Morandi P. Kaleb Linquist, Jr., M.D.    ° Dept. of Orthopaedics & Sports Medicine ° [facility] ° [address] ° [place], [place]  [address] ° Phone: [phone]   Fax: [phone] ° °  °DIET: °• Drink plenty of non-alcoholic fluids. °• Resume your normal diet. Include foods high in fiber. ° °ACTIVITY:  °• You may use crutches or a walker with weight-bearing as tolerated, unless instructed otherwise. °• You may be weaned off of the walker or crutches by your Physical Therapist.  °• Do NOT reach below the level of your knees or cross your legs until allowed.    °• Continue doing gentle exercises. Exercising will reduce the pain and swelling, increase motion, and prevent muscle weakness.   °• Please continue to use the TED compression stockings for 6 weeks. You may remove the stockings at night, but should reapply them in the morning. °• Do not drive or operate any equipment until instructed. ° °WOUND CARE:  °• Continue to use ice packs periodically to reduce pain and swelling. °• Keep the incision clean and dry. °• You may bathe or shower after the staples are removed at the first office visit following surgery. ° °MEDICATIONS: °• You may resume your regular medications. °• Please take the pain medication as prescribed on the medication. °• Do not take pain medication on an empty stomach. °• You have been given a prescription for a blood thinner to prevent blood clots. Please take the medication as instructed. (NOTE: After completing a 2 week course of Lovenox, take one Enteric-coated aspirin once a day.) °• Pain medications and iron supplements can cause constipation. Use a stool softener (Senokot or Colace) on a daily basis and a laxative (dulcolax or miralax) as needed. °• Do not drive or drink alcoholic beverages when taking pain medications. ° °CALL THE OFFICE FOR: °• Temperature above 101 degrees °• Excessive bleeding or drainage on the dressing. °• Excessive  swelling, coldness, or paleness of the toes. °• Persistent nausea and vomiting. ° °FOLLOW-UP:  °• You should have an appointment to return to the office in 6 weeks after surgery. °• Arrangements have been made for continuation of Physical Therapy (either home therapy or outpatient therapy). °  °

## 2017-06-24 NOTE — NC FL2 (Signed)
[place] LEVEL OF CARE SCREENING TOOL     IDENTIFICATION  Patient Name: Peter Grant Birthdate: 1930/12/19 Sex: male Admission Date (Current Location): 06/24/2017  [place] and [place] Number:  Engineering geologist and Address:  [facility], [address], [place], [place] [address]      Provider Number: [id number]  Attending Physician Name and Address:  Dereck Leep, MD  Relative Name and Phone Number:       Current Level of Care: Hospital Recommended Level of Care: [place] Prior Approval Number:    Date Approved/Denied:   PASRR Number:  [id number])  Discharge Plan: SNF    Current Diagnoses: Patient Active Problem List   Diagnosis Date Noted  . Status post total replacement of hip 06/24/2017    Orientation RESPIRATION BLADDER Height & Weight     Self, Time, Situation, Place  Normal Continent Weight: 175 lb (79.4 kg) Height:  5\' 8"  (172.7 cm)  BEHAVIORAL SYMPTOMS/MOOD NEUROLOGICAL BOWEL NUTRITION STATUS      Continent Diet (Regular Diet )  AMBULATORY STATUS COMMUNICATION OF NEEDS Skin   Extensive Assist Verbally Surgical wounds (Incision: Right Hip. )                       Personal Care Assistance Level of Assistance  Bathing, Feeding, Dressing Bathing Assistance: Limited assistance Feeding assistance: Independent Dressing Assistance: Limited assistance     Functional Limitations Info  Sight, Hearing, Speech Sight Info: Adequate Hearing Info: Adequate Speech Info: Adequate    SPECIAL CARE FACTORS FREQUENCY  PT (By licensed PT), OT (By licensed OT)     PT Frequency:  (5) OT Frequency:  (5)            Contractures      Additional Factors Info  Code Status, Allergies Code Status Info:  (Full Code. ) Allergies Info:  (no known allergies. )           Current Medications (06/24/2017):  This is the current hospital active medication list Current Facility-Administered  Medications  Medication Dose Route Frequency Provider Last Rate Last Dose  . 0.9 %  sodium chloride infusion   Intravenous Continuous Hooten, Laurice Record, MD 100 mL/hr at 06/24/17 1251    . acetaminophen (OFIRMEV) IV 1,000 mg  1,000 mg Intravenous Q6H Hooten, Laurice Record, MD   Stopped at 06/24/17 1306  . acetaminophen (TYLENOL) tablet 650 mg  650 mg Oral Q4H PRN Hooten, Laurice Record, MD       Or  . acetaminophen (TYLENOL) suppository 650 mg  650 mg Rectal Q4H PRN Hooten, Laurice Record, MD      . alum & mag hydroxide-simeth (MAALOX/MYLANTA) 200-200-20 MG/5ML suspension 30 mL  30 mL Oral Q4H PRN Hooten, Laurice Record, MD      . Derrill Memo ON 06/25/2017] amLODipine (NORVASC) tablet 10 mg  10 mg Oral Daily Hooten, Laurice Record, MD      . bisacodyl (DULCOLAX) suppository 10 mg  10 mg Rectal Daily PRN Hooten, Laurice Record, MD      . ceFAZolin (ANCEF) 2 g in dextrose 5 % 100 mL IVPB  2 g Intravenous Q6H Dereck Leep, MD 240 mL/hr at 06/24/17 1509 2 g at 06/24/17 1509  . diphenhydrAMINE (BENADRYL) 12.5 MG/5ML elixir 12.5-25 mg  12.5-25 mg Oral Q4H PRN Hooten, Laurice Record, MD      . Derrill Memo ON 06/25/2017] enoxaparin (LOVENOX) injection 30 mg  30 mg Subcutaneous Q12H Hooten, Laurice Record,  MD      . ferrous sulfate tablet 325 mg  325 mg Oral BID WC Hooten, Laurice Record, MD      . hydrochlorothiazide (HYDRODIURIL) tablet 25 mg  25 mg Oral Daily Hooten, Laurice Record, MD      . Derrill Memo ON 06/25/2017] Influenza vac split quadrivalent PF (FLUZONE HIGH-DOSE) injection 0.5 mL  0.5 mL Intramuscular Tomorrow-1000 Hooten, Laurice Record, MD      . Derrill Memo ON 06/25/2017] levothyroxine (SYNTHROID, LEVOTHROID) tablet 50 mcg  50 mcg Oral QAC breakfast Hooten, Laurice Record, MD      . magnesium hydroxide (MILK OF MAGNESIA) suspension 30 mL  30 mL Oral Daily PRN Hooten, Laurice Record, MD      . menthol-cetylpyridinium (CEPACOL) lozenge 3 mg  1 lozenge Oral PRN Hooten, Laurice Record, MD       Or  . phenol (CHLORASEPTIC) mouth spray 1 spray  1 spray Mouth/Throat PRN Hooten, Laurice Record, MD      .  metoCLOPramide (REGLAN) tablet 10 mg  10 mg Oral TID AC & HS Hooten, Laurice Record, MD   10 mg at 06/24/17 1253  . morphine 2 MG/ML injection 2 mg  2 mg Intravenous Q2H PRN Hooten, Laurice Record, MD      . multivitamin with minerals tablet 1 tablet  1 tablet Oral Daily Hooten, Laurice Record, MD      . omega-3 acid ethyl esters (LOVAZA) capsule 1 g  1 g Oral Daily Hooten, Laurice Record, MD      . ondansetron (ZOFRAN) tablet 4 mg  4 mg Oral Q6H PRN Hooten, Laurice Record, MD       Or  . ondansetron (ZOFRAN) injection 4 mg  4 mg Intravenous Q6H PRN Hooten, Laurice Record, MD      . oxyCODONE (Oxy IR/ROXICODONE) immediate release tablet 10 mg  10 mg Oral Q3H PRN Hooten, Laurice Record, MD      . oxyCODONE (Oxy IR/ROXICODONE) immediate release tablet 5 mg  5 mg Oral Q3H PRN Hooten, Laurice Record, MD   5 mg at 06/24/17 1353  . pantoprazole (PROTONIX) EC tablet 40 mg  40 mg Oral BID Hooten, Laurice Record, MD      . polycarbophil (FIBERCON) tablet 625 mg  625 mg Oral Daily Hooten, Laurice Record, MD      . potassium chloride (K-DUR) CR tablet 10 mEq  10 mEq Oral Daily Hooten, Laurice Record, MD      . senna-docusate (Senokot-S) tablet 1 tablet  1 tablet Oral BID Hooten, Laurice Record, MD      . sodium phosphate (FLEET) 7-19 GM/118ML enema 1 enema  1 enema Rectal Once PRN Hooten, Laurice Record, MD      . traMADol Veatrice Bourbon) tablet 50-100 mg  50-100 mg Oral Q4H PRN Hooten, Laurice Record, MD      . vitamin B-12 (CYANOCOBALAMIN) tablet 1,000 mcg  1,000 mcg Oral Daily Hooten, Laurice Record, MD         Discharge Medications: Please see discharge summary for a list of discharge medications.  Relevant Imaging Results:  Relevant Lab Results:   Additional Information  (SSN: [id number])  Tollie Canada, Veronia Beets, LCSW

## 2017-06-24 NOTE — H&P (Signed)
The patient has been re-examined, and the chart reviewed, and there have been no interval changes to the documented history and physical.    The risks, benefits, and alternatives have been discussed at length. The patient expressed understanding of the risks benefits and agreed with plans for surgical intervention.  Britt Petroni P. Couper Juncaj, Jr. M.D.    

## 2017-06-24 NOTE — Op Note (Signed)
OPERATIVE NOTE  DATE OF SURGERY:  06/24/2017  PATIENT NAME:  Peter Grant   DOB: 1930/12/23  MRN: [id number]  PRE-OPERATIVE DIAGNOSIS: Degenerative arthrosis of the right hip, primary  POST-OPERATIVE DIAGNOSIS:  Same  PROCEDURE:  Right total hip arthroplasty  SURGEON:  Marciano Sequin. M.D.  ASSISTANT:  Vance Peper, PA (present and scrubbed throughout the case, critical for assistance with exposure, retraction, instrumentation, and closure)  ANESTHESIA: spinal  ESTIMATED BLOOD LOSS: 50 mL  FLUIDS REPLACED: 1200 mL of crystalloid  DRAINS: 2 medium drains to a Hemovac reservoir  IMPLANTS UTILIZED: DePuy 13.5 mm large stature AML femoral stem, 52 mm OD Pinnacle 100 acetabular component, neutral Pinnacle Marathon polyethylene insert, and a 36 mm M-SPEC +1.5 mm hip ball  INDICATIONS FOR SURGERY: Peter Grant is a 83 y.o. year old male with a long history of progressive hip and groin  pain. X-rays demonstrated severe degenerative changes. The patient had not seen any significant improvement despite conservative nonsurgical intervention. After discussion of the risks and benefits of surgical intervention, the patient expressed understanding of the risks benefits and agree with plans for total hip arthroplasty.   The risks, benefits, and alternatives were discussed at length including but not limited to the risks of infection, bleeding, nerve injury, stiffness, blood clots, the need for revision surgery, limb length inequality, dislocation, cardiopulmonary complications, among others, and they were willing to proceed.  PROCEDURE IN DETAIL: The patient was brought into the operating room and, after adequate spinal anesthesia was achieved, the patient was placed in a left lateral decubitus position. Axillary roll was placed and all bony prominences were well-padded. The patient's right hip was cleaned and prepped with alcohol and DuraPrep and draped in the usual sterile fashion. A "timeout" was  performed as per usual protocol. A lateral curvilinear incision was made gently curving towards the posterior superior iliac spine. The IT band was incised in line with the skin incision and the fibers of the gluteus maximus were split in line. The piriformis tendon was identified, skeletonized, and incised at its insertion to the proximal femur and reflected posteriorly. A T type posterior capsulotomy was performed. Prior to dislocation of the femoral head, a threaded Steinmann pin was inserted through a separate stab incision into the pelvis superior to the acetabulum and bent in the form of a stylus so as to assess limb length and hip offset throughout the procedure. The femoral head was then dislocated posteriorly. Inspection of the femoral head demonstrated severe degenerative changes with full-thickness loss of articular cartilage. The femoral neck cut was performed using an oscillating saw. The anterior capsule was elevated off of the femoral neck using a periosteal elevator. Attention was then directed to the acetabulum. The remnant of the labrum was excised using electrocautery. Inspection of the acetabulum also demonstrated significant degenerative changes. The acetabulum was reamed in sequential fashion up to a 51 mm diameter. Good punctate bleeding bone was encountered. A 52 mm Pinnacle 100 acetabular component was positioned and impacted into place. Good scratch fit was appreciated. A neutral polyethylene trial was inserted.  Attention was then directed to the proximal femur. A hole for reaming of the proximal femoral canal was created using a high-speed burr. The femoral canal was reamed in sequential fashion up to a 13 mm diameter. This allowed for approximately 7 cm of scratch fit. It was thus elected to ream up to a 13.5 mm diameter to allow for a line to line fit. Serial broaches were inserted up  to a 13.5 mm large stature femoral broach. Calcar region was planed and a trial reduction was  performed using a 36 mm hip ball with a +1.5 mm neck length. The limb length was felt to be somewhat long so the broach was countersunk and additional bone was removed from the calcar region. Trial components were reinserted and the hip was reduced with the 36 mm hip ball with a +1.5 mm neck length. Good equalization of limb lengths and hip offset was appreciated and excellent stability was noted both anteriorly and posteriorly. Trial components were removed. The acetabular shell was irrigated with copious amounts of normal saline with antibiotic solution and suctioned dry. A neutral Pinnacle Marathon polyethylene insert was positioned and impacted into place. Next, a 13.5 mm large stature AML femoral stem was positioned and impacted into place. Excellent scratch fit was appreciated. A trial reduction was again performed with a 36 mm hip ball with a +1.5 mm neck length. Again, good equalization of limb lengths was appreciated and excellent stability appreciated both anteriorly and posteriorly. The hip was then dislocated and the trial hip ball was removed. The Morse taper was cleaned and dried. A 36 mm M-SPEC hip ball with a was 1.5 mm neck length was placed on the trunnion and impacted into place. The hip was then reduced and placed through range of motion. Excellent stability was appreciated both anteriorly and posteriorly.  The wound was irrigated with copious amounts of normal saline with antibiotic solution and suctioned dry. Good hemostasis was appreciated. The posterior capsulotomy was repaired using #5 Ethibond. Piriformis tendon was reapproximated to the undersurface of the gluteus medius tendon using #5 Ethibond. Two medium drains were placed in the wound bed and brought out through separate stab incisions to be attached to a Hemovac reservoir. The IT band was reapproximated using interrupted sutures of #1 Vicryl. Subcutaneous tissue was approximated using first #0 Vicryl followed by #2-0 Vicryl. The  skin was closed with skin staples.  The patient tolerated the procedure well and was transported to the recovery room in stable condition.   Marciano Sequin., M.D.

## 2017-06-25 ENCOUNTER — Encounter: Payer: Self-pay | Admitting: Orthopedic Surgery

## 2017-06-25 LAB — BASIC METABOLIC PANEL
ANION GAP: 7 (ref 5–15)
BUN: 25 mg/dL — ABNORMAL HIGH (ref 6–20)
CHLORIDE: 103 mmol/L (ref 101–111)
CO2: 27 mmol/L (ref 22–32)
Calcium: 8.3 mg/dL — ABNORMAL LOW (ref 8.9–10.3)
Creatinine, Ser: 1.48 mg/dL — ABNORMAL HIGH (ref 0.61–1.24)
GFR calc non Af Amer: 42 mL/min — ABNORMAL LOW (ref >60)
GFR, EST AFRICAN AMERICAN: 48 mL/min — AB (ref >60)
GLUCOSE: 127 mg/dL — AB (ref 65–99)
POTASSIUM: 3.5 mmol/L (ref 3.5–5.1)
Sodium: 137 mmol/L (ref 135–145)

## 2017-06-25 LAB — CBC
HEMATOCRIT: 32.9 % — AB (ref 40.0–52.0)
HEMOGLOBIN: 11.2 g/dL — AB (ref 13.0–18.0)
MCH: 29.8 pg (ref 26.0–34.0)
MCHC: 34 g/dL (ref 32.0–36.0)
MCV: 87.5 fL (ref 80.0–100.0)
Platelets: 199 10*3/uL (ref 150–440)
RBC: 3.76 MIL/uL — AB (ref 4.40–5.90)
RDW: 14.1 % (ref 11.5–14.5)
WBC: 11.1 10*3/uL — AB (ref 3.8–10.6)

## 2017-06-25 MED ORDER — TRAMADOL HCL 50 MG PO TABS: 50.0000 mg | ORAL_TABLET | ORAL | 0 refills | Status: DC | PRN

## 2017-06-25 MED ORDER — OXYCODONE HCL 10 MG PO TABS: 10.0000 mg | ORAL_TABLET | ORAL | 0 refills | Status: DC | PRN

## 2017-06-25 NOTE — Anesthesia Postprocedure Evaluation (Signed)
Anesthesia Post Note  Patient: Peter Grant  Procedure(s) Performed: TOTAL HIP ARTHROPLASTY (Right Hip)  Patient location during evaluation: Nursing Unit Anesthesia Type: Spinal Level of consciousness: awake, awake and alert and oriented Pain management: pain level controlled Vital Signs Assessment: post-procedure vital signs reviewed and stable Respiratory status: spontaneous breathing Cardiovascular status: blood pressure returned to baseline Postop Assessment: no headache, no backache, no apparent nausea or vomiting and adequate PO intake Anesthetic complications: no     Last Vitals:  Vitals:   06/25/17 0457 06/25/17 0740  BP: 115/60 140/71  Pulse: 67 74  Resp: 19 18  Temp: 36.6 C 36.8 C  SpO2: 93% 93%    Last Pain:  Vitals:   06/25/17 0740  TempSrc: Oral  PainSc:                  [name]

## 2017-06-25 NOTE — Progress Notes (Signed)
Milk of mag administered

## 2017-06-25 NOTE — Progress Notes (Signed)
Clinical Social Worker (CSW) received SNF consult. PT is recommending home health. RN case manager aware of above. Please reconsult if future social work needs arise. CSW signing off.   Adley Mazurowski, LCSW [phone] 

## 2017-06-25 NOTE — Discharge Summary (Signed)
Physician Discharge Summary  Patient ID: Peter Grant MRN: [id number] DOB/AGE: [birth year] 83 y.o.  Admit date: 06/24/2017 Discharge date: 06/26/2017  Admission Diagnoses:  PRIMARY OSTEOARTHRITIS OF RIGHT HIP   Discharge Diagnoses: Patient Active Problem List   Diagnosis Date Noted  . Status post total replacement of hip 06/24/2017    Past Medical History:  Diagnosis Date  . Arthritis 2018   left hip and fingers  . Cancer ([place]) 2012   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  . Hypertension   . Hypothyroidism   . Sleep apnea    no cpap since he has not had a study     Transfusion: None   Consultants (if any):   Discharged Condition: Improved  Hospital Course: Kaelon Weekes is an 83 y.o. male who was admitted 06/24/2017 with a diagnosis of degenerative arthrosis right hip and went to the operating room on 06/24/2017 and underwent the above named procedures.    Surgeries:Procedure(s): TOTAL HIP ARTHROPLASTY on 06/24/2017  PRE-OPERATIVE DIAGNOSIS: Degenerative arthrosis of the right hip, primary  POST-OPERATIVE DIAGNOSIS:  Same  PROCEDURE:  Right total hip arthroplasty  SURGEON:  Marciano Sequin. M.D.  ASSISTANT:  Vance Peper, PA (present and scrubbed throughout the case, critical for assistance with exposure, retraction, instrumentation, and closure)  ANESTHESIA: spinal  ESTIMATED BLOOD LOSS: 50 mL  FLUIDS REPLACED: 1200 mL of crystalloid  DRAINS: 2 medium drains to a Hemovac reservoir  IMPLANTS UTILIZED: DePuy 13.5 mm large stature AML femoral stem, 52 mm OD Pinnacle 100 acetabular component, neutral Pinnacle Marathon polyethylene insert, and a 36 mm M-SPEC +1.5 mm hip ball  INDICATIONS FOR SURGERY: Peter Grant is a 83 y.o. year old male with a long history of progressive hip and groin  pain. X-rays demonstrated severe degenerative changes. The patient had not seen any significant improvement despite conservative nonsurgical  intervention. After discussion of the risks and benefits of surgical intervention, the patient expressed understanding of the risks benefits and agree with plans for total hip arthroplasty.   The risks, benefits, and alternatives were discussed at length including but not limited to the risks of infection, bleeding, nerve injury, stiffness, blood clots, the need for revision surgery, limb length inequality, dislocation, cardiopulmonary complications, among others, and they were willing to proceed.  Patient tolerated the surgery well. No complications .Patient was taken to PACU where she was stabilized and then transferred to the orthopedic floor.  Patient started on Lovenox 30 mg q 12 hrs. Foot pumps applied bilaterally at 80 mm hgb. Heels elevated off bed with rolled towels. No evidence of DVT. Calves non tender. Negative Homan. Physical therapy started on day #1 for gait training and transfer with OT starting on  day #1 for ADL and assisted devices. Patient has done well with therapy. Ambulated greater than 200 feet upon being discharged. Was able to ascend and descend 4 steps safely and independently  Patient's IV And Foley were discontinued on day #1 with Hemovac being discontinued on day #2. Dressing was changed on day 2 prior to patient being discharged   He was given perioperative antibiotics:  Anti-infectives    Start     Dose/Rate Route Frequency Ordered Stop   06/24/17 1400  ceFAZolin (ANCEF) 2 g in dextrose 5 % 100 mL IVPB     2 g 240 mL/hr over 30 Minutes Intravenous Every 6 hours 06/24/17 1148 06/25/17 1359   06/24/17 0617  ceFAZolin (ANCEF) 2-4 GM/100ML-% IVPB    Comments:  Phineas Real   : cabinet override      06/24/17 0617 06/24/17 0750   06/24/17 0600  ceFAZolin (ANCEF) IVPB 2g/100 mL premix     2 g 200 mL/hr over 30 Minutes Intravenous On call to O.R. 06/23/17 2338 06/24/17 0802    .  He was fitted with AV 1 compression foot pump devices, instructed on heel pumps,  early ambulation, and fitted with TED stockings bilaterally for DVT prophylaxis.  He benefited maximally from the hospital stay and there were no complications.    Recent vital signs:  Vitals:   06/25/17 0457 06/25/17 0740  BP: 115/60 140/71  Pulse: 67 74  Resp: 19 18  Temp: 97.8 F (36.6 C) 98.3 F (36.8 C)  SpO2: 93% 93%    Recent laboratory studies:  Lab Results  Component Value Date   HGB 11.2 (L) 06/25/2017   HGB 13.2 06/10/2017   Lab Results  Component Value Date   WBC 11.1 (H) 06/25/2017   PLT 199 06/25/2017   Lab Results  Component Value Date   INR 0.98 06/10/2017   Lab Results  Component Value Date   NA 137 06/25/2017   K 3.5 06/25/2017   CL 103 06/25/2017   CO2 27 06/25/2017   BUN 25 (H) 06/25/2017   CREATININE 1.48 (H) 06/25/2017   GLUCOSE 127 (H) 06/25/2017    Discharge Medications:   Allergies as of 06/25/2017   No Known Allergies     Medication List    STOP taking these medications   aspirin EC 81 MG tablet   Fish Oil 1000 MG Caps   meloxicam 7.5 MG tablet Commonly known as:  MOBIC     TAKE these medications   amLODipine 10 MG tablet Commonly known as:  NORVASC Take 10 mg by mouth daily.   FIBER PO Take 2 capsules by mouth daily.   hydrochlorothiazide 25 MG tablet Commonly known as:  HYDRODIURIL Take 25 mg by mouth daily.   levothyroxine 50 MCG tablet Commonly known as:  SYNTHROID, LEVOTHROID Take 50 mcg by mouth daily.   multivitamin with minerals Tabs tablet Take 1 tablet by mouth daily.   Oxycodone HCl 10 MG Tabs Take 1 tablet (10 mg total) by mouth every 4 (four) hours as needed for severe pain ((score 7 to 10)).   potassium chloride 10 MEQ tablet Commonly known as:  K-DUR Take 10 mEq by mouth daily.   traMADol 50 MG tablet Commonly known as:  ULTRAM Take 1-2 tablets (50-100 mg total) by mouth every 4 (four) hours as needed for moderate pain.   vitamin B-12 1000 MCG tablet Commonly known as:   CYANOCOBALAMIN Take 1,000 mcg by mouth daily.            Durable Medical Equipment        Start     Ordered   06/24/17 1149  DME Walker rolling  Once    Question:  Patient needs a walker to treat with the following condition  Answer:  S/P total hip arthroplasty   06/24/17 1148   06/24/17 1149  DME Bedside commode  Once    Question:  Patient needs a bedside commode to treat with the following condition  Answer:  S/P total hip arthroplasty   06/24/17 1148      Diagnostic Studies: Dg Hip Port Unilat With Pelvis 1v Right  Result Date: 06/24/2017 CLINICAL DATA:  Status post total hip replacement EXAM: DG HIP (WITH OR WITHOUT PELVIS) 2V PORT RIGHT COMPARISON:  None. FINDINGS: Frontal pelvis as well as frontal and lateral right hip images were obtained. There is a total hip replacement on the right with prosthetic components well-seated. No acute fracture or dislocation. There is a drain in the right hip region as well as overlying skin staples. Left hip joint appears unremarkable. Sacroiliac joints appear unremarkable. IMPRESSION: Total hip replacement on the right with prosthetic components appearing well-seated. No acute fracture or dislocation. Left hip joint appears unremarkable. Electronically Signed   By: Lowella Grip III M.D.   On: 06/24/2017 11:18    Disposition: Final discharge disposition not confirmed  Discharge Instructions    Diet - low sodium heart healthy    Complete by:  As directed    Increase activity slowly    Complete by:  As directed       Follow-up Information    Hooten, Laurice Record, MD On 08/06/2017.   Specialty:  Orthopedic Surgery Why:  at 10:45am Contact information: [place] [address] [phone]            Signed: Watt Climes 06/25/2017, 7:46 AM

## 2017-06-25 NOTE — Progress Notes (Signed)
   Subjective: 1 Day Post-Op Procedure(s) (LRB): TOTAL HIP ARTHROPLASTY (Right) Patient reports pain as 3 on 0-10 scale.   Patient is well, and has had no acute complaints or problems Did well with therapy with standing and sitting in a chair yesterday. Plan is to go Home after hospital stay. no nausea and no vomiting Patient denies any chest pains or shortness of breath. No new complaints  Objective: Vital signs in last 24 hours: Temp:  [97.5 F (36.4 C)-98.2 F (36.8 C)] 97.8 F (36.6 C) (10/25 0457) Pulse Rate:  [51-84] 67 (10/25 0457) Resp:  [14-20] 19 (10/25 0457) BP: (98-138)/(55-78) 115/60 (10/25 0457) SpO2:  [92 %-100 %] 93 % (10/25 0457) well approximated incision Heels are non tender and elevated off the bed using rolled towels Intake/Output from previous day: 10/24 0701 - 10/25 0700 In: 3205 [P.O.:1020; I.V.:1525; IV Piggyback:660] Out: 1935 [NTIRW:4315; Drains:155; Blood:50] Intake/Output this shift: No intake/output data recorded.   Recent Labs  06/25/17 0515  HGB 11.2*    Recent Labs  06/25/17 0515  WBC 11.1*  RBC 3.76*  HCT 32.9*  PLT 199    Recent Labs  06/25/17 0515  NA 137  K 3.5  CL 103  CO2 27  BUN 25*  CREATININE 1.48*  GLUCOSE 127*  CALCIUM 8.3*   No results for input(s): LABPT, INR in the last 72 hours.  EXAM General - Patient is Alert, Appropriate and Oriented Extremity - Neurologically intact Neurovascular intact Sensation intact distally Intact pulses distally Dorsiflexion/Plantar flexion intact No cellulitis present Compartment soft Dressing - dressing C/D/I Motor Function - intact, moving foot and toes well on exam.    Past Medical History:  Diagnosis Date  . Arthritis 2018   left hip and fingers  . Cancer ([place]) 2012   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  . Hypertension   . Hypothyroidism   . Sleep apnea    no cpap since he has not had a study    Assessment/Plan: 1 Day Post-Op  Procedure(s) (LRB): TOTAL HIP ARTHROPLASTY (Right) Active Problems:   Status post total replacement of hip  Estimated body mass index is 26.61 kg/m as calculated from the following:   Height as of this encounter: 5\' 8"  (1.727 m).   Weight as of this encounter: 79.4 kg (175 lb). Advance diet Up with therapy D/C IV fluids Plan for discharge tomorrow Discharge home with home health  Labs: Were reviewed and acceptable DVT Prophylaxis - Lovenox, Foot Pumps and TED hose Weight-Bearing as tolerated to right leg D/C O2 and Pulse OX and try on Room Air Begin working on bowel movement. Patient states that he uses prune juice home.  Jillyn Ledger. [facility] 06/25/2017, 7:40 AM

## 2017-06-25 NOTE — Progress Notes (Signed)
Physical Therapy Treatment Patient Details Name: Peter Grant MRN: [id number] DOB: 23-May-1931 Today's Date: 06/25/2017    History of Present Illness Pt is an 83 y.o. M s/p R THA with posterior hip precautions on 06/24/17.    PT Comments    Pt performed exercises and able to progress walking distance. Still min guard for sit to stand and gait (73ft with RW); min assist for sit to supine in bed for RLE elevation onto bed. Pt able to use sufficient upper body strength to scoot to [facility] with trapeze bar. Next session plan to progress ambulation distance and execute stairs.   Follow Up Recommendations  Home health PT     Equipment Recommendations  Rolling walker with 5" wheels    Recommendations for Other Services       Precautions / Restrictions Precautions Precautions: Posterior Hip Precaution Booklet Issued: Yes (comment) Precaution Comments: pt able to recall 3/3 hip precautions Restrictions Weight Bearing Restrictions: Yes RLE Weight Bearing: Weight bearing as tolerated    Mobility  Bed Mobility Overal bed mobility: Needs Assistance Bed Mobility: Sit to Supine     Sit to supine: Min assist   General bed mobility comments: min assist for RLE movement onto bed; vc's for sequencing; pt used trapeze bar for scoot to [facility]  Transfers Overall transfer level: Needs assistance Equipment used: Rolling walker (2 wheeled) Transfers: Sit to/from Stand Sit to Stand: Min guard         General transfer comment: min guard for safety; vc's for extending RLE to maintian hip precautions, pushing off of armrests with arms into standing, reaching for bed with arms before sitting, slow and controlled descent  Ambulation/Gait Ambulation/Gait assistance: Min guard Ambulation Distance (Feet): 60 Feet Assistive device: Rolling walker (2 wheeled)       General Gait Details: decreased gait speed; short steps, decreased stride length bilaterally; decreased stance time on R; vc's for heel  strike gait pattern, turning R foot out to R before turning body to maintain precautions   Stairs            Wheelchair Mobility    Modified Rankin (Stroke Patients Only)       Balance Overall balance assessment: Needs assistance Sitting-balance support: Feet supported Sitting balance-Leahy Scale: Good Sitting balance - Comments: able to sit at EOB, hands in lap, feet supported without LOB   Standing balance support: Single extremity supported;During functional activity Standing balance-Leahy Scale: Good Standing balance comment: RW for BUE support in standing, gait                            Cognition Arousal/Alertness: Awake/alert Behavior During Therapy: WFL for tasks assessed/performed Overall Cognitive Status: Within Functional Limits for tasks assessed                                        Exercises Total Joint Exercises Ankle Circles/Pumps: AROM;Both;10 reps;Supine Quad Sets: AROM;Strengthening;Both;10 reps;Supine Gluteal Sets: Strengthening;Both;10 reps;Supine Towel Squeeze: Strengthening;AROM;Both;10 reps;Supine Short Arc Quad: AROM;Both;10 reps;Supine Heel Slides: AROM;Left;AAROM;Right;10 reps;Supine Hip ABduction/ADduction: AROM;Left;AAROM;Right;10 reps;Supine    General Comments        Pertinent Vitals/Pain Pain Assessment: Faces Pain Score: 5  Faces Pain Scale: Hurts a little bit Pain Location: R hip Pain Descriptors / Indicators: Discomfort;Guarding;Sore Pain Intervention(s): Limited activity within patient's tolerance;Monitored during session  PT Goals (current goals can now be found in the care plan section) Acute Rehab PT Goals Patient Stated Goal: to go home PT Goal Formulation: With patient Time For Goal Achievement: 07/08/17 Potential to Achieve Goals: Good Progress towards PT goals: Progressing toward goals    Frequency    BID      PT Plan      Co-evaluation               AM-PAC PT "6 Clicks" Daily Activity  Outcome Measure  Difficulty turning over in bed (including adjusting bedclothes, sheets and blankets)?: A Little Difficulty moving from lying on back to sitting on the side of the bed? : A Lot Difficulty sitting down on and standing up from a chair with arms (e.g., wheelchair, bedside commode, etc,.)?: A Little Help needed moving to and from a bed to chair (including a wheelchair)?: A Little Help needed walking in hospital room?: A Little Help needed climbing 3-5 steps with a railing? : A Lot 6 Click Score: 16    End of Session Equipment Utilized During Treatment: Gait belt Activity Tolerance: Patient tolerated treatment well Patient left: in bed;with call bell/phone within reach;with bed alarm set;with SCD's reapplied (heels elevated, pillow between legs) Nurse Communication: Mobility status PT Visit Diagnosis: Unsteadiness on feet (R26.81);Other abnormalities of gait and mobility (R26.89);Muscle weakness (generalized) (M62.81);Pain Pain - Right/Left: Right Pain - part of body: Hip     Time: 7681-1572 PT Time Calculation (min) (ACUTE ONLY): 24 min   G Codes:          Wetzel Bjornstad, SPT 06/25/2017, 3:44 PM

## 2017-06-25 NOTE — Progress Notes (Signed)
Physical Therapy Treatment Patient Details Name: Vernal Hritz MRN: [id number] DOB: 1931/06/24 Today's Date: 06/25/2017    History of Present Illness Pt is an 83 y.o. M s/p R THA with posterior hip precautions on 06/24/17.    PT Comments    Pt making progress with exercises and mobility tasks. Supervision for bed mobility, min guard for transfers and gait (60ft with RW). Next session plan to progress exercises and mobility as appropriate.   Follow Up Recommendations  Home health PT     Equipment Recommendations  Rolling walker with 5" wheels    Recommendations for Other Services       Precautions / Restrictions Precautions Precautions: Posterior Hip Precaution Booklet Issued: Yes (comment) Precaution Comments: pt able to recall 1/3 hip precautions Restrictions Weight Bearing Restrictions: Yes RLE Weight Bearing: Weight bearing as tolerated    Mobility  Bed Mobility Overal bed mobility: Needs Assistance Bed Mobility: Supine to Sit     Supine to sit: Supervision     General bed mobility comments: Supervision for safety; vc's for sequencing  Transfers Overall transfer level: Needs assistance Equipment used: Rolling walker (2 wheeled) Transfers: Sit to/from Stand Sit to Stand: Min guard         General transfer comment: min guard for safety; vc's for extending RLE to maintian hip precautions, pushing off of bed with arms into standing  Ambulation/Gait Ambulation/Gait assistance: Min guard Ambulation Distance (Feet): 40 Feet Assistive device: Rolling walker (2 wheeled)       General Gait Details: decreased gait speed; short steps, decreased stride length bilaterally; decreased stance time on R; vc's for heel strike gait pattern, turning R foot out to R before turning body to maintain precautions   Stairs            Wheelchair Mobility    Modified Rankin (Stroke Patients Only)       Balance Overall balance assessment: Needs  assistance Sitting-balance support: Feet supported Sitting balance-Leahy Scale: Good Sitting balance - Comments: able to sit at EOB, hands in lap, feet supported without LOB   Standing balance support: Single extremity supported;During functional activity Standing balance-Leahy Scale: Good Standing balance comment: RW for BUE support in standing, gait; able to reach forward outside of BOS with LUE in standing to push IV pole out of way                            Cognition Arousal/Alertness: Awake/alert Behavior During Therapy: [facility] for tasks assessed/performed Overall Cognitive Status: Within Functional Limits for tasks assessed                                        Exercises Total Joint Exercises Ankle Circles/Pumps: AROM;Both;10 reps;Supine Quad Sets: AROM;Strengthening;Both;10 reps;Supine Gluteal Sets: Strengthening;Both;10 reps;Supine Towel Squeeze: Strengthening;AROM;Both;10 reps;Supine Short Arc Quad: AROM;Both;10 reps;Supine Heel Slides: AROM;Left;AAROM;Right;10 reps;Supine Hip ABduction/ADduction: AROM;Left;AAROM;Right;10 reps;Supine    General Comments        Pertinent Vitals/Pain Pain Score: 5  Pain Location: R hip Pain Descriptors / Indicators: Discomfort;Guarding;Sore Pain Intervention(s): Limited activity within patient's tolerance;Monitored during session;Premedicated before session    Home Living                      Prior Function            PT Goals (current goals can now be found in  the care plan section) Acute Rehab PT Goals Patient Stated Goal: to go home PT Goal Formulation: With patient Time For Goal Achievement: 07/08/17 Potential to Achieve Goals: Good Progress towards PT goals: Progressing toward goals    Frequency    BID      PT Plan      Co-evaluation              AM-PAC PT "6 Clicks" Daily Activity  Outcome Measure  Difficulty turning over in bed (including adjusting bedclothes,  sheets and blankets)?: A Little Difficulty moving from lying on back to sitting on the side of the bed? : A Lot Difficulty sitting down on and standing up from a chair with arms (e.g., wheelchair, bedside commode, etc,.)?: A Lot Help needed moving to and from a bed to chair (including a wheelchair)?: A Little Help needed walking in hospital room?: A Little Help needed climbing 3-5 steps with a railing? : A Lot 6 Click Score: 15    End of Session Equipment Utilized During Treatment: Gait belt Activity Tolerance: Patient tolerated treatment well Patient left: in chair;with call bell/phone within reach;with chair alarm set;with SCD's reapplied (heels elevated, pillow between legs) Nurse Communication: Mobility status PT Visit Diagnosis: Unsteadiness on feet (R26.81);Other abnormalities of gait and mobility (R26.89);Muscle weakness (generalized) (M62.81);Pain Pain - Right/Left: Right Pain - part of body: Hip     Time: 0160-1093 PT Time Calculation (min) (ACUTE ONLY): 25 min  Charges:                       G CodesWetzel Bjornstad, SPT 06/25/2017, 12:09 PM

## 2017-06-25 NOTE — Evaluation (Signed)
Occupational Therapy Evaluation Patient Details Name: Peter Grant MRN: [id number] DOB: 12-12-1930 Today's Date: 06/25/2017    History of Present Illness Pt is an 83 y.o. M s/p R THA with posterior hip precautions on 06/24/17.   Clinical Impression   Pt is 83 year old male s/p R THA(posterior hip precautions)  who lives at home with his wife in a home with several modifications in bathroom with grab bars, raised toilet seats, walk in shower with built in seat . Pt was independent in ADLs and active golfing and doing yard work prior to surgery and is eager to return to PLOF.  Pt is currently limited in functional ADLs due to pain and decreased AROM in RLE and decreased recall of post hip precautions.  Pt requires min assist for LB dressing and bathing skills using AD due to pain and decreased AROM of R LE  and would benefit from continued skilled OT services for education in assistive devices, functional mobility, and education in recommendations for home modifications to increase safety and prevent falls.  Pt is a good candidate for [facility] OT to continue rehabilitation.      Follow Up Recommendations  Home health OT    Equipment Recommendations  Other (comment) (rec buying a different reacher, sock aid and LH shoe horn)    Recommendations for Other Services       Precautions / Restrictions Precautions Precautions: Posterior Hip Precaution Booklet Issued: No Precaution Comments: pt able to recall 2/3 hip precautions Restrictions Weight Bearing Restrictions: Yes RLE Weight Bearing: Weight bearing as tolerated      Mobility Bed Mobility Overal bed mobility: Needs Assistance Bed Mobility: Supine to Sit     Supine to sit: Supervision     General bed mobility comments: Supervision for safety; vc's for sequencing  Transfers Overall transfer level: Needs assistance Equipment used: Rolling walker (2 wheeled) Transfers: Sit to/from Stand Sit to Stand: Min guard         General  transfer comment: min guard for safety; vc's for extending RLE to maintian hip precautions, pushing off of bed with arms into standing    Balance Overall balance assessment: Needs assistance Sitting-balance support: Feet supported Sitting balance-Leahy Scale: Good Sitting balance - Comments: able to sit at EOB, hands in lap, feet supported without LOB   Standing balance support: Single extremity supported;During functional activity Standing balance-Leahy Scale: Good Standing balance comment: RW for BUE support in standing, gait; able to reach forward outside of BOS with LUE in standing to push IV pole out of way                           ADL either performed or assessed with clinical judgement   ADL Overall ADL's : Needs assistance/impaired Eating/Feeding: Independent;Set up   Grooming: Wash/dry hands;Wash/dry face;Oral care;Applying deodorant;Independent;Set up   Upper Body Bathing: Independent;Set up   Lower Body Bathing: Moderate assistance;Set up   Upper Body Dressing : Independent;Set up   Lower Body Dressing: Minimal assistance;With adaptive equipment;Set up;Adhering to hip precautions                       Vision Patient Visual Report: No change from baseline       Perception     Praxis      Pertinent Vitals/Pain Pain Assessment: 0-10 Pain Score: 5  Pain Location: R hip Pain Descriptors / Indicators: Discomfort;Guarding;Sore Pain Intervention(s): Limited activity within patient's tolerance;Monitored during  session;Premedicated before session;Repositioned     Hand Dominance Right   Extremity/Trunk Assessment Upper Extremity Assessment Upper Extremity Assessment: Overall WFL for tasks assessed   Lower Extremity Assessment Lower Extremity Assessment: Defer to PT evaluation   Cervical / Trunk Assessment Cervical / Trunk Assessment: Normal   Communication Communication Communication: No difficulties   Cognition Arousal/Alertness:  Awake/alert Behavior During Therapy: WFL for tasks assessed/performed Overall Cognitive Status: Within Functional Limits for tasks assessed                                     General Comments       Exercises   Shoulder Instructions      Home Living Family/patient expects to be discharged to:: Private residence Living Arrangements: Spouse/significant other Available Help at Discharge: Family Type of Home: House Home Access: Stairs to enter Technical brewer of Steps: 3 Entrance Stairs-Rails: Left Home Layout: Two level Alternate Level Stairs-Number of Steps: 2 in center of house Alternate Level Stairs-Rails: Can reach both Bathroom Shower/Tub: Walk-in shower;Door   [employer] Toilet: Handicapped height Bathroom Accessibility: Yes How Accessible: Accessible via walker Home Equipment: Cane - single point;Shower seat - built in;Grab bars - toilet;Grab bars - tub/shower;Hand held shower head;Adaptive equipment Adaptive Equipment: Reacher        Prior Functioning/Environment Level of Independence: Independent        Comments: Independent with all ADLs; active with golf, yard work and other projects around home        OT Problem List: Decreased strength;Decreased range of motion;Decreased activity tolerance;Pain;Decreased knowledge of precautions;Decreased knowledge of use of DME or AE      OT Treatment/Interventions: Self-care/ADL training;DME and/or AE instruction;Therapeutic activities;Patient/family education    OT Goals(Current goals can be found in the care plan section) Acute Rehab OT Goals Patient Stated Goal: to go home OT Goal Formulation: With patient Time For Goal Achievement: 07/09/17 Potential to Achieve Goals: Good  OT Frequency: Min 1X/week   Barriers to D/C:            Co-evaluation              AM-PAC PT "6 Clicks" Daily Activity     Outcome Measure Help from another person eating meals?: None Help from another person  taking care of personal grooming?: None Help from another person toileting, which includes using toliet, bedpan, or urinal?: A Little Help from another person bathing (including washing, rinsing, drying)?: A Little Help from another person to put on and taking off regular upper body clothing?: None Help from another person to put on and taking off regular lower body clothing?: A Little 6 Click Score: 21   End of Session Equipment Utilized During Treatment: Gait belt  Activity Tolerance: Patient tolerated treatment well Patient left: in chair;with call bell/phone within reach;with chair alarm set;with nursing/sitter in room;Other (comment) (NSG in room giving meds)  OT Visit Diagnosis: Pain;Muscle weakness (generalized) (M62.81);Other abnormalities of gait and mobility (R26.89) Pain - Right/Left: Right Pain - part of body: Hip                Time: 1130-1210 OT Time Calculation (min): 40 min Charges:  OT General Charges $OT Visit: 1 Visit OT Evaluation $OT Eval Low Complexity: 1 Low OT Treatments $Self Care/Home Management : 23-37 mins G-Codes:     Chrys Racer, OTR/L ascom [phone] 06/25/17, 12:23 PM

## 2017-06-25 NOTE — Progress Notes (Signed)
Pt alert and oriented. Medicated for pain through the night with good results. Pain control with oral pain medication and scheduled Tylenol. IV infusing without difficulty. Foley patent and draining urine. Surgical dressing dry and intact. Pt able to sleep in between care.

## 2017-06-25 NOTE — Care Management Note (Signed)
Case Management Note  Patient Details  Name: Peter Grant MRN: [id number] Date of Birth: 25-Apr-1931  Subjective/Objective: RNCM consult for discharge planning. Met with patient at bedside. He lives at home with his wife. Prior to admission, patient was active, driving and playing golf. Offered choice of home health agencies. Patient would like to use [facility]. He has a friend that is a physical thearpist there and prefers their agency. Referral to [place] at [facility]. Requested next day visit. Ordered a walker from [place] with [facility]. Pharmacy: [facility] [phone]. Will call Lovenox for price. IT is anticipated patient will discharge home tomorrow.                   Action/Plan: [facility] for HHPT, Walker from [facility].   Expected Discharge Date:                  Expected Discharge Plan:  [place]  In-House Referral:     Discharge planning Services  CM Consult  Post Acute Care Choice:  Durable Medical Equipment, Home Health Choice offered to:  Patient  DME Arranged:  Walker rolling DME Agency:  [place] Arranged:  PT [facility] Agency:  [facility]  Status of Service:  In process, will continue to follow  If discussed at Long Length of Stay Meetings, dates discussed:    Additional Comments:  Jolly Mango, RN 06/25/2017, 9:09 AM

## 2017-06-26 LAB — CBC
HEMATOCRIT: 31.2 % — AB (ref 40.0–52.0)
Hemoglobin: 10.5 g/dL — ABNORMAL LOW (ref 13.0–18.0)
MCH: 29.2 pg (ref 26.0–34.0)
MCHC: 33.6 g/dL (ref 32.0–36.0)
MCV: 87 fL (ref 80.0–100.0)
Platelets: 180 10*3/uL (ref 150–440)
RBC: 3.59 MIL/uL — AB (ref 4.40–5.90)
RDW: 14.1 % (ref 11.5–14.5)
WBC: 11.1 10*3/uL — AB (ref 3.8–10.6)

## 2017-06-26 LAB — BASIC METABOLIC PANEL
ANION GAP: 6 (ref 5–15)
BUN: 27 mg/dL — ABNORMAL HIGH (ref 6–20)
CO2: 29 mmol/L (ref 22–32)
Calcium: 8.3 mg/dL — ABNORMAL LOW (ref 8.9–10.3)
Chloride: 99 mmol/L — ABNORMAL LOW (ref 101–111)
Creatinine, Ser: 1.42 mg/dL — ABNORMAL HIGH (ref 0.61–1.24)
GFR calc Af Amer: 51 mL/min — ABNORMAL LOW (ref >60)
GFR, EST NON AFRICAN AMERICAN: 44 mL/min — AB (ref >60)
GLUCOSE: 140 mg/dL — AB (ref 65–99)
POTASSIUM: 3.4 mmol/L — AB (ref 3.5–5.1)
Sodium: 134 mmol/L — ABNORMAL LOW (ref 135–145)

## 2017-06-26 LAB — SURGICAL PATHOLOGY

## 2017-06-26 MED ORDER — ENOXAPARIN SODIUM 40 MG/0.4ML ~~LOC~~ SOLN: 40.0000 mg | SUBCUTANEOUS | Status: DC

## 2017-06-26 MED ORDER — LACTULOSE 10 GM/15ML PO SOLN
10.0000 g | Freq: Two times a day (BID) | ORAL | Status: DC | PRN
  Administered 2017-06-26: 10 g via ORAL
  Filled 2017-06-26: qty 30

## 2017-06-26 MED ORDER — ENOXAPARIN SODIUM 40 MG/0.4ML ~~LOC~~ SOLN: 40.0000 mg | SUBCUTANEOUS | 0 refills | Status: DC

## 2017-06-26 NOTE — Progress Notes (Signed)
Physical Therapy Treatment Patient Details Name: Peter Grant MRN: [id number] DOB: 1931-08-01 Today's Date: 06/26/2017    History of Present Illness Pt is an 83 y.o. M s/p R THA with posterior hip precautions on 06/24/17.    PT Comments    Pt able to safely navigate stairs (4 steps, railing use, min guard for safety) and progress ambulation distance to 151f (full loop) with RW, min guard for safety. Pt reports 7-8/10 pain with activity, but did not receive pain meds prior to session; nursing notified pt requesting pain meds following session. If pt does not d/c in pm, plan to progress exercises and ambulation distance as tolerable.  Follow Up Recommendations  Home health PT     Equipment Recommendations  Rolling walker with 5" wheels    Recommendations for Other Services       Precautions / Restrictions Precautions Precautions: Posterior Hip Precaution Booklet Issued: Yes (comment) Precaution Comments: pt able to recall 2/3 hip precautions Restrictions Weight Bearing Restrictions: Yes RLE Weight Bearing: Weight bearing as tolerated    Mobility  Bed Mobility Overal bed mobility: Needs Assistance Bed Mobility: Sit to Supine     Supine to sit: Supervision     General bed mobility comments: vc's for sequencing; increased time and effort to perform, difficulties noted moving RLE  Transfers Overall transfer level: Needs assistance Equipment used: Rolling walker (2 wheeled) Transfers: Sit to/from Stand Sit to Stand: Min guard         General transfer comment: min guard for safety; vc's for slow descent, increased time to perform, but no difficulties noted  Ambulation/Gait Ambulation/Gait assistance: Min guard Ambulation Distance (Feet): 160 Feet Assistive device: Rolling walker (2 wheeled)       General Gait Details: decreased gait speed; short steps, decreased stride length bilaterally; decreased stance time on R; mild antalgic gait; vc's for heel strike gait  pattern, standing upright, turning R foot out to R before turning body to maintain precautions   Stairs Stairs: Yes   Stair Management: One rail Left;Step to pattern;Forwards Number of Stairs: 4 General stair comments: min vc's for sequencing, pushing weight through arms on railing while in RLE stance phase  Wheelchair Mobility    Modified Rankin (Stroke Patients Only)       Balance Overall balance assessment: Needs assistance Sitting-balance support: Feet supported Sitting balance-Leahy Scale: Good     Standing balance support: Single extremity supported;During functional activity Standing balance-Leahy Scale: Good Standing balance comment: RW for BUE support in standing, gait; BUE support on railing while ascending/descending stairs                            Cognition Arousal/Alertness: Awake/alert Behavior During Therapy: WFL for tasks assessed/performed Overall Cognitive Status: Within Functional Limits for tasks assessed                                        Exercises Total Joint Exercises Ankle Circles/Pumps: AROM;Both;10 reps;Supine Quad Sets: AROM;Strengthening;Both;10 reps;Supine    General Comments        Pertinent Vitals/Pain Pain Assessment: 0-10 Pain Score: 7  Pain Location: R hip Pain Descriptors / Indicators: Discomfort;Guarding;Sore Pain Intervention(s): Limited activity within patient's tolerance;Monitored during session;Patient requesting pain meds-RN notified;Relaxation    Home Living  Prior Function            PT Goals (current goals can now be found in the care plan section) Acute Rehab PT Goals Patient Stated Goal: to go home PT Goal Formulation: With patient Time For Goal Achievement: 07/08/17 Potential to Achieve Goals: Good Progress towards PT goals: Progressing toward goals    Frequency    BID      PT Plan Current plan remains appropriate    Co-evaluation               AM-PAC PT "6 Clicks" Daily Activity  Outcome Measure  Difficulty turning over in bed (including adjusting bedclothes, sheets and blankets)?: A Little Difficulty moving from lying on back to sitting on the side of the bed? : A Lot Difficulty sitting down on and standing up from a chair with arms (e.g., wheelchair, bedside commode, etc,.)?: A Little Help needed moving to and from a bed to chair (including a wheelchair)?: A Little Help needed walking in hospital room?: A Little Help needed climbing 3-5 steps with a railing? : A Little 6 Click Score: 17    End of Session Equipment Utilized During Treatment: Gait belt Activity Tolerance: Patient tolerated treatment well Patient left: in chair;with call bell/phone within reach;with chair alarm set;with SCD's reapplied (heels elevated, pillow between legs) Nurse Communication: Mobility status;Patient requests pain meds (PT goals met; adequate for d/c) PT Visit Diagnosis: Unsteadiness on feet (R26.81);Other abnormalities of gait and mobility (R26.89);Muscle weakness (generalized) (M62.81);Pain Pain - Right/Left: Right Pain - part of body: Hip     Time: 4327-6147 PT Time Calculation (min) (ACUTE ONLY): 31 min  Charges:                       G CodesWetzel Bjornstad, SPT 06/26/2017, 12:02 PM

## 2017-06-26 NOTE — Care Management Note (Signed)
Case Management Note  Patient Details  Name: Peter Grant MRN: [id number] Date of Birth: 02-Dec-1930  Subjective/Objective:  Discharging today                  Action/Plan: [facility] notified of discharge. Called to clarify Lovenox order with Dr. Marry Guan. Called Lovenox 40 mg # 14 no refills to [facility]. Cost of Lovenox is $ 27.12. Patient updated.   Expected Discharge Date:  06/26/17               Expected Discharge Plan:  [place]  In-House Referral:     Discharge planning Services  CM Consult  Post Acute Care Choice:  Durable Medical Equipment, Home Health Choice offered to:  Patient  DME Arranged:  Walker rolling DME Agency:  [place] Arranged:  PT [facility] Agency:  [facility]  Status of Service:  Completed, signed off  If discussed at [place] of Stay Meetings, dates discussed:    Additional Comments:  Jolly Mango, RN 06/26/2017, 8:57 AM

## 2017-06-26 NOTE — Progress Notes (Signed)
Discharge Instructions reviewed with patient and spouse with patient's permission. Rx given to patient for oxycodone and tramadol. VSS. IV dc'ed , cath intact. Dressing changed and additional bandages given per MD order. Patient and spouse verbalized understanding of instructions. Reminded to take all belongings including walker. Patient escorted via wheelchair to vehicle by NT, patient's wife to drive him home.

## 2017-06-26 NOTE — Progress Notes (Signed)
Patient appears to have slept well with no signs of distress.  Pain well controlled with current medications.  Up to BR with walker but no BM yet although passing gas.

## 2017-06-26 NOTE — Progress Notes (Signed)
   Subjective: 2 Days Post-Op Procedure(s) (LRB): TOTAL HIP ARTHROPLASTY (Right) Patient reports pain as mild.   Patient is well, and has had no acute complaints or problems Pt did well with therapy yesterday. Walked as far as therapy wanted him to. Plan is to go Home after hospital stay. no nausea and no vomiting Patient denies any chest pains or shortness of breath. Objective: Vital signs in last 24 hours: Temp:  [98.1 F (36.7 C)-98.7 F (37.1 C)] 98.7 F (37.1 C) (10/26 0724) Pulse Rate:  [73-93] 92 (10/26 0724) Resp:  [16-19] 18 (10/26 0724) BP: (117-135)/(62-70) 135/70 (10/26 0724) SpO2:  [88 %-94 %] 92 % (10/26 0724) well approximated incision Heels are non tender and elevated off the bed using rolled towels Intake/Output from previous day: 10/25 0701 - 10/26 0700 In: 1600 [P.O.:1600] Out: 1105 [Urine:950; Drains:155] Intake/Output this shift: Total I/O In: -  Out: 220 [Urine:220]   Recent Labs  06/25/17 0515 06/26/17 0457  HGB 11.2* 10.5*    Recent Labs  06/25/17 0515 06/26/17 0457  WBC 11.1* 11.1*  RBC 3.76* 3.59*  HCT 32.9* 31.2*  PLT 199 180    Recent Labs  06/25/17 0515 06/26/17 0457  NA 137 134*  K 3.5 3.4*  CL 103 99*  CO2 27 29  BUN 25* 27*  CREATININE 1.48* 1.42*  GLUCOSE 127* 140*  CALCIUM 8.3* 8.3*   No results for input(s): LABPT, INR in the last 72 hours.  EXAM General - Patient is Alert, Appropriate and Oriented Extremity - Neurologically intact Neurovascular intact Sensation intact distally Intact pulses distally Dorsiflexion/Plantar flexion intact No cellulitis present Compartment soft Dressing - moderate drainage Motor Function - intact, moving foot and toes well on exam.    Past Medical History:  Diagnosis Date  . Arthritis 2018   left hip and fingers  . 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 Hypertension   . Hypothyroidism   . Sleep apnea    no cpap since he has not  had a study    Assessment/Plan: 2 Days Post-Op Procedure(s) (LRB): TOTAL HIP ARTHROPLASTY (Right) Active Problems:   Status post total replacement of hip  Estimated body mass index is 26.61 kg/m as calculated from the following:   Height as of this encounter: 5\' 8"  (1.727 m).   Weight as of this encounter: 79.4 kg (175 lb). Up with therapy Discharge home with home health  Labs: k+ 3.4 which is where he normally runs DVT Prophylaxis - Lovenox, Foot Pumps and TED hose Weight-Bearing as tolerated to right leg Hemovac discontinue today Pt needs a bowel movement. Lactulose added Please change dressing prior to d/c and give the pt 2 extra honeycomb dressing to take home   [place] [place] 06/26/2017, 7:57 AM

## 2018-01-11 ENCOUNTER — Other Ambulatory Visit: Payer: Self-pay | Admitting: Internal Medicine

## 2018-01-11 DIAGNOSIS — M5116 Intervertebral disc disorders with radiculopathy, lumbar region: Secondary | ICD-10-CM

## 2018-01-13 ENCOUNTER — Ambulatory Visit
Admission: RE | Admit: 2018-01-13 | Discharge: 2018-01-13 | Disposition: A | Discharge status: Home / Self Care | Payer: Medicare Other | Source: Ambulatory Visit | Attending: Internal Medicine | Admitting: Internal Medicine

## 2018-01-13 DIAGNOSIS — M48061 Spinal stenosis, lumbar region without neurogenic claudication: Secondary | ICD-10-CM | POA: Insufficient documentation

## 2018-01-13 DIAGNOSIS — M5116 Intervertebral disc disorders with radiculopathy, lumbar region: Secondary | ICD-10-CM | POA: Diagnosis present

## 2018-01-13 DIAGNOSIS — M4316 Spondylolisthesis, lumbar region: Secondary | ICD-10-CM | POA: Diagnosis not present

## 2018-03-23 ENCOUNTER — Other Ambulatory Visit: Payer: Self-pay | Admitting: Neurosurgery

## 2018-03-23 DIAGNOSIS — M4807 Spinal stenosis, lumbosacral region: Secondary | ICD-10-CM

## 2018-03-24 ENCOUNTER — Ambulatory Visit
Admission: RE | Admit: 2018-03-24 | Discharge: 2018-03-24 | Disposition: A | Discharge status: Home / Self Care | Payer: Medicare Other | Source: Ambulatory Visit | Attending: Neurosurgery | Admitting: Neurosurgery

## 2018-03-24 DIAGNOSIS — M48061 Spinal stenosis, lumbar region without neurogenic claudication: Secondary | ICD-10-CM | POA: Insufficient documentation

## 2018-03-24 DIAGNOSIS — M4807 Spinal stenosis, lumbosacral region: Secondary | ICD-10-CM

## 2018-03-24 DIAGNOSIS — Q7649 Other congenital malformations of spine, not associated with scoliosis: Secondary | ICD-10-CM | POA: Diagnosis not present

## 2018-03-24 DIAGNOSIS — M5136 Other intervertebral disc degeneration, lumbar region: Secondary | ICD-10-CM | POA: Insufficient documentation

## 2018-03-24 DIAGNOSIS — M5126 Other intervertebral disc displacement, lumbar region: Secondary | ICD-10-CM | POA: Diagnosis not present

## 2018-03-29 ENCOUNTER — Encounter
Admission: RE | Admit: 2018-03-29 | Discharge: 2018-03-29 | Disposition: A | Discharge status: Home / Self Care | Payer: Medicare Other | Source: Ambulatory Visit | Attending: Neurosurgery | Admitting: Neurosurgery

## 2018-03-29 ENCOUNTER — Other Ambulatory Visit: Payer: Self-pay

## 2018-03-29 DIAGNOSIS — I129 Hypertensive chronic kidney disease with stage 1 through stage 4 chronic kidney disease, or unspecified chronic kidney disease: Secondary | ICD-10-CM | POA: Diagnosis not present

## 2018-03-29 DIAGNOSIS — I10 Essential (primary) hypertension: Secondary | ICD-10-CM | POA: Insufficient documentation

## 2018-03-29 DIAGNOSIS — Z01810 Encounter for preprocedural cardiovascular examination: Secondary | ICD-10-CM

## 2018-03-29 DIAGNOSIS — Z01812 Encounter for preprocedural laboratory examination: Secondary | ICD-10-CM

## 2018-03-29 DIAGNOSIS — M5416 Radiculopathy, lumbar region: Secondary | ICD-10-CM | POA: Diagnosis not present

## 2018-03-29 DIAGNOSIS — M48062 Spinal stenosis, lumbar region with neurogenic claudication: Secondary | ICD-10-CM | POA: Diagnosis present

## 2018-03-29 DIAGNOSIS — Z7982 Long term (current) use of aspirin: Secondary | ICD-10-CM | POA: Diagnosis not present

## 2018-03-29 DIAGNOSIS — G4730 Sleep apnea, unspecified: Secondary | ICD-10-CM | POA: Diagnosis not present

## 2018-03-29 DIAGNOSIS — N183 Chronic kidney disease, stage 3 (moderate): Secondary | ICD-10-CM | POA: Diagnosis not present

## 2018-03-29 DIAGNOSIS — R9431 Abnormal electrocardiogram [ECG] [EKG]: Secondary | ICD-10-CM

## 2018-03-29 DIAGNOSIS — Z79899 Other long term (current) drug therapy: Secondary | ICD-10-CM | POA: Diagnosis not present

## 2018-03-29 DIAGNOSIS — E785 Hyperlipidemia, unspecified: Secondary | ICD-10-CM | POA: Diagnosis not present

## 2018-03-29 DIAGNOSIS — Z85820 Personal history of malignant melanoma of skin: Secondary | ICD-10-CM | POA: Diagnosis not present

## 2018-03-29 DIAGNOSIS — M6281 Muscle weakness (generalized): Secondary | ICD-10-CM | POA: Diagnosis not present

## 2018-03-29 DIAGNOSIS — E039 Hypothyroidism, unspecified: Secondary | ICD-10-CM | POA: Diagnosis not present

## 2018-03-29 HISTORY — DX: Hyperlipidemia, unspecified: E78.5

## 2018-03-29 HISTORY — DX: Chronic kidney disease, stage 3 (moderate): N18.3

## 2018-03-29 HISTORY — DX: Nonrheumatic aortic (valve) stenosis: I35.0

## 2018-03-29 HISTORY — DX: Chronic kidney disease, stage 3 unspecified: N18.30

## 2018-03-29 LAB — URINALYSIS, ROUTINE W REFLEX MICROSCOPIC
BILIRUBIN URINE: NEGATIVE
GLUCOSE, UA: NEGATIVE mg/dL
HGB URINE DIPSTICK: NEGATIVE
Ketones, ur: NEGATIVE mg/dL
Leukocytes, UA: NEGATIVE
Nitrite: NEGATIVE
PH: 5 (ref 5.0–8.0)
Protein, ur: NEGATIVE mg/dL
SPECIFIC GRAVITY, URINE: 1.016 (ref 1.005–1.030)

## 2018-03-29 LAB — SURGICAL PCR SCREEN
MRSA, PCR: NEGATIVE
STAPHYLOCOCCUS AUREUS: NEGATIVE

## 2018-03-29 LAB — BASIC METABOLIC PANEL
Anion gap: 11 (ref 5–15)
BUN: 26 mg/dL — AB (ref 8–23)
CO2: 32 mmol/L (ref 22–32)
Calcium: 9.7 mg/dL (ref 8.9–10.3)
Chloride: 98 mmol/L (ref 98–111)
Creatinine, Ser: 1.32 mg/dL — ABNORMAL HIGH (ref 0.61–1.24)
GFR calc Af Amer: 55 mL/min — ABNORMAL LOW (ref >60)
GFR, EST NON AFRICAN AMERICAN: 48 mL/min — AB (ref >60)
Glucose, Bld: 100 mg/dL — ABNORMAL HIGH (ref 70–99)
POTASSIUM: 3 mmol/L — AB (ref 3.5–5.1)
SODIUM: 141 mmol/L (ref 135–145)

## 2018-03-29 LAB — TYPE AND SCREEN
ABO/RH(D): A POS
ANTIBODY SCREEN: NEGATIVE

## 2018-03-29 LAB — CBC
HCT: 38.7 % — ABNORMAL LOW (ref 40.0–52.0)
Hemoglobin: 13.4 g/dL (ref 13.0–18.0)
MCH: 30.8 pg (ref 26.0–34.0)
MCHC: 34.5 g/dL (ref 32.0–36.0)
MCV: 89.3 fL (ref 80.0–100.0)
PLATELETS: 243 10*3/uL (ref 150–440)
RBC: 4.34 MIL/uL — AB (ref 4.40–5.90)
RDW: 14.5 % (ref 11.5–14.5)
WBC: 7.5 10*3/uL (ref 3.8–10.6)

## 2018-03-29 LAB — DIFFERENTIAL
BASOS ABS: 0 10*3/uL (ref 0–0.1)
BASOS PCT: 1 %
Eosinophils Absolute: 0.2 10*3/uL (ref 0–0.7)
Eosinophils Relative: 2 %
Lymphocytes Relative: 24 %
Lymphs Abs: 1.8 10*3/uL (ref 1.0–3.6)
MONO ABS: 0.7 10*3/uL (ref 0.2–1.0)
Monocytes Relative: 9 %
NEUTROS ABS: 4.8 10*3/uL (ref 1.4–6.5)
Neutrophils Relative %: 64 %

## 2018-03-29 LAB — PROTIME-INR
INR: 0.94
Prothrombin Time: 12.5 seconds (ref 11.4–15.2)

## 2018-03-29 LAB — APTT: APTT: 27 s (ref 24–36)

## 2018-03-29 NOTE — Patient Instructions (Signed)
Your procedure is scheduled on: Thursday 04/01/18 Report to [facility]. To find out your arrival time please call [phone] between 1PM - 3PM on Wednesday 03/31/18.  Remember: Instructions that are not followed completely may result in serious medical risk, up to and including death, or upon the discretion of your surgeon and anesthesiologist your surgery may need to be rescheduled.     _X__ 1. Do not eat food after midnight the night before your procedure.                 No gum chewing or hard candies. You may drink clear liquids up to 2 hours                 before you are scheduled to arrive for your surgery- DO not drink clear                 liquids within 2 hours of the start of your surgery.                 Clear Liquids include:  water, apple juice without pulp, clear carbohydrate                 drink such as Clearfast or Gatorade, Black Coffee or Tea (Do not add                 anything to coffee or tea).  __X__2.  On the morning of surgery brush your teeth with toothpaste and water, you                 may rinse your mouth with mouthwash if you wish.  Do not swallow any              toothpaste of mouthwash.     _X__ 3.  No Alcohol for 24 hours before or after surgery.   _X__ 4.  Do Not Smoke or use e-cigarettes For 24 Hours Prior to Your Surgery.                 Do not use any chewable tobacco products for at least 6 hours prior to                 surgery.  ____  5.  Bring all medications with you on the day of surgery if instructed.   __X__  6.  Notify your doctor if there is any change in your medical condition      (cold, fever, infections).     Do not wear jewelry, make-up, hairpins, clips or nail polish. Do not wear lotions, powders, or perfumes.  Do not shave 48 hours prior to surgery. Men may shave face and neck. Do not bring valuables to the hospital.    [facility] is not responsible for any belongings or  valuables.  Contacts, dentures/partials or body piercings may not be worn into surgery. Bring a case for your contacts, glasses or hearing aids, a denture cup will be supplied. Leave your suitcase in the car. After surgery it may be brought to your room. For patients admitted to the hospital, discharge time is determined by your treatment team.   Patients discharged the day of surgery will not be allowed to drive home.   Please read over the following fact sheets that you were given:   MRSA Information  __X__ Take these medicines the morning of surgery with A SIP OF WATER:  1. amLODipine (NORVASC  2. levothyroxine (SYNTHROID  3.   4.  5.  6.  ____ Fleet Enema (as directed)   __X__ Use CHG Soap/SAGE wipes as directed  ____ Use inhalers on the day of surgery  ____ Stop metformin/Janumet/Farxiga 2 days prior to surgery    ____ Take 1/2 of usual insulin dose the night before surgery. No insulin the morning          of surgery.   __X__ Stop Blood Thinners Coumadin/Plavix/Xarelto/Pleta/Pradaxa/Eliquis/Effient/Aspirin  on (STOPPED)    __X__ Stop Anti-inflammatories 7 days before surgery such as Advil, Ibuprofen, Motrin,  BC or Goodies Powder, Naprosyn, Naproxen, Aleve, MAY TAKE TYLENOL   __X__ Stop all herbal supplements, fish oil or vitamin E until after surgery.    ____ Bring C-Pap to the hospital.

## 2018-03-30 NOTE — Pre-Procedure Instructions (Signed)
Met B and CBC results sent to Dr. Cook and Anesthesia for review. 

## 2018-03-30 NOTE — Pre-Procedure Instructions (Signed)
FAXED REQUEST FOR KT SUPP TO BE STARTED. EKG COMPARED WITH 10/18

## 2018-04-01 ENCOUNTER — Encounter
Admission: RE | Disposition: A | Discharge status: Home / Self Care | Payer: Self-pay | Source: Ambulatory Visit | Attending: Neurosurgery

## 2018-04-01 ENCOUNTER — Other Ambulatory Visit: Payer: Self-pay

## 2018-04-01 ENCOUNTER — Ambulatory Visit: Payer: Medicare Other | Admitting: Anesthesiology

## 2018-04-01 ENCOUNTER — Observation Stay
Admission: RE | Admit: 2018-04-01 | Discharge: 2018-04-02 | Disposition: A | Discharge status: Home / Self Care | Payer: Medicare Other | Source: Ambulatory Visit | Attending: Neurosurgery | Admitting: Neurosurgery

## 2018-04-01 ENCOUNTER — Ambulatory Visit: Payer: Medicare Other

## 2018-04-01 DIAGNOSIS — E785 Hyperlipidemia, unspecified: Secondary | ICD-10-CM | POA: Insufficient documentation

## 2018-04-01 DIAGNOSIS — G4730 Sleep apnea, unspecified: Secondary | ICD-10-CM | POA: Insufficient documentation

## 2018-04-01 DIAGNOSIS — N183 Chronic kidney disease, stage 3 (moderate): Secondary | ICD-10-CM | POA: Insufficient documentation

## 2018-04-01 DIAGNOSIS — Z7982 Long term (current) use of aspirin: Secondary | ICD-10-CM | POA: Insufficient documentation

## 2018-04-01 DIAGNOSIS — Z79899 Other long term (current) drug therapy: Secondary | ICD-10-CM | POA: Insufficient documentation

## 2018-04-01 DIAGNOSIS — Z419 Encounter for procedure for purposes other than remedying health state, unspecified: Secondary | ICD-10-CM

## 2018-04-01 DIAGNOSIS — Z85820 Personal history of malignant melanoma of skin: Secondary | ICD-10-CM | POA: Insufficient documentation

## 2018-04-01 DIAGNOSIS — M48062 Spinal stenosis, lumbar region with neurogenic claudication: Principal | ICD-10-CM | POA: Insufficient documentation

## 2018-04-01 DIAGNOSIS — I129 Hypertensive chronic kidney disease with stage 1 through stage 4 chronic kidney disease, or unspecified chronic kidney disease: Secondary | ICD-10-CM | POA: Insufficient documentation

## 2018-04-01 DIAGNOSIS — E039 Hypothyroidism, unspecified: Secondary | ICD-10-CM | POA: Insufficient documentation

## 2018-04-01 DIAGNOSIS — M5416 Radiculopathy, lumbar region: Secondary | ICD-10-CM | POA: Diagnosis present

## 2018-04-01 DIAGNOSIS — M6281 Muscle weakness (generalized): Secondary | ICD-10-CM | POA: Insufficient documentation

## 2018-04-01 HISTORY — PX: LUMBAR LAMINECTOMY/DECOMPRESSION MICRODISCECTOMY: SHX5026

## 2018-04-01 LAB — POCT I-STAT 4, (NA,K, GLUC, HGB,HCT)
Glucose, Bld: 99 mg/dL (ref 70–99)
HEMATOCRIT: 37 % — AB (ref 39.0–52.0)
HEMOGLOBIN: 12.6 g/dL — AB (ref 13.0–17.0)
Potassium: 3.1 mmol/L — ABNORMAL LOW (ref 3.5–5.1)
Sodium: 139 mmol/L (ref 135–145)

## 2018-04-01 SURGERY — LUMBAR LAMINECTOMY/DECOMPRESSION MICRODISCECTOMY 1 LEVEL
Anesthesia: General | Site: Back | Wound class: Clean

## 2018-04-01 MED ORDER — SODIUM CHLORIDE 0.9 % IV SOLN
INTRAVENOUS | Status: DC | PRN
  Administered 2018-04-01: 20 ug/min via INTRAVENOUS

## 2018-04-01 MED ORDER — CEFAZOLIN SODIUM-DEXTROSE 2-4 GM/100ML-% IV SOLN
INTRAVENOUS | Status: AC
  Filled 2018-04-01: qty 100

## 2018-04-01 MED ORDER — METHYLPREDNISOLONE ACETATE 40 MG/ML IJ SUSP
INTRAMUSCULAR | Status: DC | PRN
  Administered 2018-04-01: 40 mg

## 2018-04-01 MED ORDER — MORPHINE SULFATE (PF) 4 MG/ML IV SOLN
1.0000 mg | INTRAVENOUS | Status: DC | PRN
  Administered 2018-04-01 – 2018-04-02 (×2): 1 mg via INTRAVENOUS
  Filled 2018-04-01 (×2): qty 1

## 2018-04-01 MED ORDER — ROCURONIUM BROMIDE 50 MG/5ML IV SOLN
INTRAVENOUS | Status: AC
  Filled 2018-04-01: qty 1

## 2018-04-01 MED ORDER — FAMOTIDINE 20 MG PO TABS
20.0000 mg | ORAL_TABLET | Freq: Once | ORAL | Status: AC
  Administered 2018-04-01: 20 mg via ORAL

## 2018-04-01 MED ORDER — ROCURONIUM BROMIDE 100 MG/10ML IV SOLN
INTRAVENOUS | Status: DC | PRN
  Administered 2018-04-01: 5 mg via INTRAVENOUS
  Administered 2018-04-01: 30 mg via INTRAVENOUS
  Administered 2018-04-01: 10 mg via INTRAVENOUS
  Administered 2018-04-01: 5 mg via INTRAVENOUS

## 2018-04-01 MED ORDER — METHYLPREDNISOLONE ACETATE 40 MG/ML IJ SUSP
INTRAMUSCULAR | Status: AC
  Filled 2018-04-01: qty 1

## 2018-04-01 MED ORDER — SENNA 8.6 MG PO TABS
1.0000 | ORAL_TABLET | Freq: Two times a day (BID) | ORAL | Status: DC
  Administered 2018-04-01 – 2018-04-02 (×2): 8.6 mg via ORAL
  Filled 2018-04-01 (×2): qty 1

## 2018-04-01 MED ORDER — HYDROMORPHONE HCL 1 MG/ML IJ SOLN
INTRAMUSCULAR | Status: AC
  Administered 2018-04-01: 0.5 mg via INTRAVENOUS
  Filled 2018-04-01: qty 1

## 2018-04-01 MED ORDER — SODIUM CHLORIDE 0.9 % IV SOLN: INTRAVENOUS | Status: DC

## 2018-04-01 MED ORDER — LIDOCAINE HCL (PF) 2 % IJ SOLN
INTRAMUSCULAR | Status: AC
  Filled 2018-04-01: qty 10

## 2018-04-01 MED ORDER — SUCCINYLCHOLINE CHLORIDE 20 MG/ML IJ SOLN
INTRAMUSCULAR | Status: DC | PRN
  Administered 2018-04-01: 100 mg via INTRAVENOUS

## 2018-04-01 MED ORDER — HYDROCODONE-ACETAMINOPHEN 7.5-325 MG PO TABS: 1.0000 | ORAL_TABLET | Freq: Once | ORAL | Status: DC | PRN

## 2018-04-01 MED ORDER — CEFAZOLIN SODIUM-DEXTROSE 2-4 GM/100ML-% IV SOLN
2.0000 g | Freq: Once | INTRAVENOUS | Status: AC
  Administered 2018-04-01: 2 g via INTRAVENOUS

## 2018-04-01 MED ORDER — LACTATED RINGERS IV SOLN: INTRAVENOUS | Status: DC

## 2018-04-01 MED ORDER — THROMBIN 5000 UNITS EX SOLR
CUTANEOUS | Status: DC | PRN
  Administered 2018-04-01: 5000 [IU] via TOPICAL

## 2018-04-01 MED ORDER — LIDOCAINE HCL (CARDIAC) PF 100 MG/5ML IV SOSY
PREFILLED_SYRINGE | INTRAVENOUS | Status: DC | PRN
  Administered 2018-04-01: 100 mg via INTRAVENOUS

## 2018-04-01 MED ORDER — ONDANSETRON HCL 4 MG/2ML IJ SOLN
INTRAMUSCULAR | Status: AC
  Filled 2018-04-01: qty 2

## 2018-04-01 MED ORDER — HYDROCHLOROTHIAZIDE 25 MG PO TABS
25.0000 mg | ORAL_TABLET | Freq: Every day | ORAL | Status: DC
  Administered 2018-04-02: 25 mg via ORAL
  Filled 2018-04-01: qty 1

## 2018-04-01 MED ORDER — BUPIVACAINE-EPINEPHRINE (PF) 0.5% -1:200000 IJ SOLN
INTRAMUSCULAR | Status: AC
  Filled 2018-04-01: qty 30

## 2018-04-01 MED ORDER — SODIUM CHLORIDE 0.9 % IR SOLN
Status: DC | PRN
  Administered 2018-04-01: 1000 mL

## 2018-04-01 MED ORDER — HYDROMORPHONE HCL 1 MG/ML IJ SOLN
INTRAMUSCULAR | Status: AC
  Administered 2018-04-01: 0.25 mg via INTRAVENOUS
  Filled 2018-04-01: qty 1

## 2018-04-01 MED ORDER — FAMOTIDINE 20 MG PO TABS: 20.0000 mg | ORAL_TABLET | Freq: Once | ORAL | Status: DC

## 2018-04-01 MED ORDER — LIDOCAINE-EPINEPHRINE (PF) 1 %-1:200000 IJ SOLN
INTRAMUSCULAR | Status: DC | PRN
  Administered 2018-04-01: 10 mL

## 2018-04-01 MED ORDER — FENTANYL CITRATE (PF) 100 MCG/2ML IJ SOLN
INTRAMUSCULAR | Status: AC
  Filled 2018-04-01: qty 2

## 2018-04-01 MED ORDER — PROPOFOL 10 MG/ML IV BOLUS
INTRAVENOUS | Status: AC
  Filled 2018-04-01: qty 20

## 2018-04-01 MED ORDER — SODIUM CHLORIDE 0.9 % IJ SOLN
INTRAMUSCULAR | Status: AC
  Filled 2018-04-01: qty 10

## 2018-04-01 MED ORDER — ONDANSETRON HCL 4 MG PO TABS: 4.0000 mg | ORAL_TABLET | Freq: Four times a day (QID) | ORAL | Status: DC | PRN

## 2018-04-01 MED ORDER — MEPERIDINE HCL 50 MG/ML IJ SOLN: 6.2500 mg | INTRAMUSCULAR | Status: DC | PRN

## 2018-04-01 MED ORDER — HYDROMORPHONE HCL 1 MG/ML IJ SOLN
0.2500 mg | INTRAMUSCULAR | Status: DC | PRN
  Administered 2018-04-01: 0.5 mg via INTRAVENOUS
  Administered 2018-04-01 (×4): 0.25 mg via INTRAVENOUS

## 2018-04-01 MED ORDER — BACITRACIN 50000 UNITS IM SOLR
INTRAMUSCULAR | Status: AC
  Filled 2018-04-01: qty 1

## 2018-04-01 MED ORDER — LACTATED RINGERS IV SOLN
INTRAVENOUS | Status: DC
  Administered 2018-04-01: 12:00:00 via INTRAVENOUS

## 2018-04-01 MED ORDER — ACETAMINOPHEN 160 MG/5ML PO SOLN
325.0000 mg | ORAL | Status: DC | PRN
  Filled 2018-04-01: qty 20.3

## 2018-04-01 MED ORDER — BUPIVACAINE HCL (PF) 0.25 % IJ SOLN
INTRAMUSCULAR | Status: DC | PRN
  Administered 2018-04-01: 30 mL

## 2018-04-01 MED ORDER — VITAMIN B-12 1000 MCG PO TABS
1000.0000 ug | ORAL_TABLET | Freq: Every day | ORAL | Status: DC
  Administered 2018-04-01 – 2018-04-02 (×2): 1000 ug via ORAL
  Filled 2018-04-01 (×2): qty 1

## 2018-04-01 MED ORDER — SUGAMMADEX SODIUM 200 MG/2ML IV SOLN
INTRAVENOUS | Status: AC
  Filled 2018-04-01: qty 2

## 2018-04-01 MED ORDER — POTASSIUM CHLORIDE ER 10 MEQ PO TBCR
10.0000 meq | EXTENDED_RELEASE_TABLET | Freq: Every day | ORAL | Status: DC
  Administered 2018-04-01 – 2018-04-02 (×2): 10 meq via ORAL
  Filled 2018-04-01 (×3): qty 1

## 2018-04-01 MED ORDER — PROPOFOL 10 MG/ML IV BOLUS
INTRAVENOUS | Status: DC | PRN
  Administered 2018-04-01: 120 mg via INTRAVENOUS

## 2018-04-01 MED ORDER — LEVOTHYROXINE SODIUM 50 MCG PO TABS
50.0000 ug | ORAL_TABLET | Freq: Every day | ORAL | Status: DC
  Administered 2018-04-02: 50 ug via ORAL
  Filled 2018-04-01: qty 1

## 2018-04-01 MED ORDER — SUGAMMADEX SODIUM 200 MG/2ML IV SOLN
INTRAVENOUS | Status: DC | PRN
  Administered 2018-04-01: 200 mg via INTRAVENOUS

## 2018-04-01 MED ORDER — TRAMADOL HCL 50 MG PO TABS
50.0000 mg | ORAL_TABLET | Freq: Four times a day (QID) | ORAL | Status: DC | PRN
  Administered 2018-04-01 – 2018-04-02 (×2): 50 mg via ORAL
  Filled 2018-04-01 (×2): qty 1

## 2018-04-01 MED ORDER — ACETAMINOPHEN 650 MG RE SUPP: 650.0000 mg | Freq: Four times a day (QID) | RECTAL | Status: DC | PRN

## 2018-04-01 MED ORDER — ONDANSETRON HCL 4 MG/2ML IJ SOLN: 4.0000 mg | Freq: Four times a day (QID) | INTRAMUSCULAR | Status: DC | PRN

## 2018-04-01 MED ORDER — LIDOCAINE-EPINEPHRINE (PF) 1 %-1:200000 IJ SOLN
INTRAMUSCULAR | Status: AC
  Filled 2018-04-01: qty 30

## 2018-04-01 MED ORDER — FAMOTIDINE 20 MG PO TABS
ORAL_TABLET | ORAL | Status: AC
  Administered 2018-04-01: 20 mg via ORAL
  Filled 2018-04-01: qty 1

## 2018-04-01 MED ORDER — BUPIVACAINE HCL (PF) 0.25 % IJ SOLN
INTRAMUSCULAR | Status: AC
  Filled 2018-04-01: qty 30

## 2018-04-01 MED ORDER — FENTANYL CITRATE (PF) 100 MCG/2ML IJ SOLN
INTRAMUSCULAR | Status: DC | PRN
  Administered 2018-04-01 (×2): 25 ug via INTRAVENOUS
  Administered 2018-04-01: 100 ug via INTRAVENOUS

## 2018-04-01 MED ORDER — THROMBIN 5000 UNITS EX SOLR
CUTANEOUS | Status: AC
  Filled 2018-04-01: qty 5000

## 2018-04-01 MED ORDER — PROMETHAZINE HCL 25 MG/ML IJ SOLN: 6.2500 mg | INTRAMUSCULAR | Status: DC | PRN

## 2018-04-01 MED ORDER — ACETAMINOPHEN 325 MG PO TABS: 325.0000 mg | ORAL_TABLET | ORAL | Status: DC | PRN

## 2018-04-01 MED ORDER — AMLODIPINE BESYLATE 10 MG PO TABS
10.0000 mg | ORAL_TABLET | Freq: Every day | ORAL | Status: DC
  Administered 2018-04-02: 10 mg via ORAL
  Filled 2018-04-01: qty 1

## 2018-04-01 MED ORDER — ACETAMINOPHEN 325 MG PO TABS: 650.0000 mg | ORAL_TABLET | Freq: Four times a day (QID) | ORAL | Status: DC | PRN

## 2018-04-01 MED ORDER — PHENYLEPHRINE HCL 10 MG/ML IJ SOLN
INTRAMUSCULAR | Status: DC | PRN
  Administered 2018-04-01 (×3): 100 ug via INTRAVENOUS
  Administered 2018-04-01 (×3): 50 ug via INTRAVENOUS

## 2018-04-01 MED ORDER — ONDANSETRON HCL 4 MG/2ML IJ SOLN
INTRAMUSCULAR | Status: DC | PRN
  Administered 2018-04-01: 4 mg via INTRAVENOUS

## 2018-04-01 SURGICAL SUPPLY — 65 items
BLADE BOVIE TIP EXT 4 (BLADE) ×3 IMPLANT
BUR NEURO DRILL SOFT 3.0X3.8M (BURR) ×3 IMPLANT
CANISTER SUCT 1200ML W/VALVE (MISCELLANEOUS) ×6 IMPLANT
CHLORAPREP W/TINT 26ML (MISCELLANEOUS) ×6 IMPLANT
CNTNR SPEC 2.5X3XGRAD LEK (MISCELLANEOUS) ×1
CONT SPEC 4OZ STER OR WHT (MISCELLANEOUS) ×2
CONTAINER SPEC 2.5X3XGRAD LEK (MISCELLANEOUS) ×1 IMPLANT
COUNTER NEEDLE 20/40 LG (NEEDLE) IMPLANT
COVER LIGHT HANDLE STERIS (MISCELLANEOUS) ×6 IMPLANT
CUP MEDICINE 2OZ PLAST GRAD ST (MISCELLANEOUS) ×3 IMPLANT
DERMABOND ADVANCED (GAUZE/BANDAGES/DRESSINGS) ×2
DERMABOND ADVANCED .7 DNX12 (GAUZE/BANDAGES/DRESSINGS) ×1 IMPLANT
DRAPE C-ARM XRAY 36X54 (DRAPES) ×6 IMPLANT
DRAPE LAPAROTOMY 100X77 ABD (DRAPES) ×3 IMPLANT
DRAPE MICROSCOPE SPINE 48X150 (DRAPES) ×3 IMPLANT
DRAPE POUCH INSTRU U-SHP 10X18 (DRAPES) ×3 IMPLANT
DRAPE SURG 17X11 SM STRL (DRAPES) ×3 IMPLANT
DRAPE TABLE BACK 80X90 (DRAPES) IMPLANT
DRSG OPSITE POSTOP 4X10 (GAUZE/BANDAGES/DRESSINGS) ×3 IMPLANT
DURASEAL APPLICATOR TIP (TIP) IMPLANT
DURASEAL SPINE SEALANT 3ML (MISCELLANEOUS) IMPLANT
ELECT CAUTERY BLADE TIP 2.5 (TIP) ×3
ELECT EZSTD 165MM 6.5IN (MISCELLANEOUS) ×3
ELECT REM PT RETURN 9FT ADLT (ELECTROSURGICAL) ×3
ELECTRODE CAUTERY BLDE TIP 2.5 (TIP) ×1 IMPLANT
ELECTRODE EZSTD 165MM 6.5IN (MISCELLANEOUS) ×1 IMPLANT
ELECTRODE REM PT RTRN 9FT ADLT (ELECTROSURGICAL) ×1 IMPLANT
GAUZE SPONGE 4X4 12PLY STRL (GAUZE/BANDAGES/DRESSINGS) ×3 IMPLANT
GLOVE BIOGEL PI IND STRL 7.0 (GLOVE) ×1 IMPLANT
GLOVE BIOGEL PI IND STRL 8 (GLOVE) ×1 IMPLANT
GLOVE BIOGEL PI INDICATOR 7.0 (GLOVE) ×2
GLOVE BIOGEL PI INDICATOR 8 (GLOVE) ×2
GLOVE SURG SYN 6.5 ES PF (GLOVE) ×6 IMPLANT
GLOVE SURG SYN 8.0 (GLOVE) ×9 IMPLANT
GOWN STRL REUS W/ TWL LRG LVL3 (GOWN DISPOSABLE) ×1 IMPLANT
GOWN STRL REUS W/ TWL XL LVL3 (GOWN DISPOSABLE) ×1 IMPLANT
GOWN STRL REUS W/TWL LRG LVL3 (GOWN DISPOSABLE) ×2
GOWN STRL REUS W/TWL MED LVL3 (GOWN DISPOSABLE) ×3 IMPLANT
GOWN STRL REUS W/TWL XL LVL3 (GOWN DISPOSABLE) ×2
GRADUATE 1200CC STRL 31836 (MISCELLANEOUS) ×3 IMPLANT
KIT TURNOVER KIT A (KITS) ×3 IMPLANT
KIT WILSON FRAME (KITS) ×3 IMPLANT
KNIFE BAYONET SHORT DISCETOMY (MISCELLANEOUS) IMPLANT
MARKER SKIN DUAL TIP RULER LAB (MISCELLANEOUS) ×6 IMPLANT
NDL SAFETY ECLIPSE 18X1.5 (NEEDLE) ×1 IMPLANT
NEEDLE HYPO 18GX1.5 SHARP (NEEDLE) ×2
NEEDLE HYPO 22GX1.5 SAFETY (NEEDLE) ×3 IMPLANT
NS IRRIG 1000ML POUR BTL (IV SOLUTION) ×3 IMPLANT
PACK LAMINECTOMY NEURO (CUSTOM PROCEDURE TRAY) ×3 IMPLANT
PAD ARMBOARD 7.5X6 YLW CONV (MISCELLANEOUS) IMPLANT
SPOGE SURGIFLO 8M (HEMOSTASIS) ×2
SPONGE SURGIFLO 8M (HEMOSTASIS) ×1 IMPLANT
STAPLER SKIN PROX 35W (STAPLE) ×3 IMPLANT
SUT NURALON 4 0 TR CR/8 (SUTURE) ×3 IMPLANT
SUT POLYSORB 2-0 5X18 GS-10 (SUTURE) ×6 IMPLANT
SUT VIC AB 0 CT1 18XCR BRD 8 (SUTURE) ×2 IMPLANT
SUT VIC AB 0 CT1 8-18 (SUTURE) ×4
SUT VICRYL 2-0 SH 8X27 (SUTURE) ×3 IMPLANT
SYR 10ML LL (SYRINGE) ×6 IMPLANT
SYR 20CC LL (SYRINGE) ×3 IMPLANT
SYR 30ML LL (SYRINGE) ×6 IMPLANT
SYR 3ML LL SCALE MARK (SYRINGE) ×3 IMPLANT
TOWEL OR 17X26 4PK STRL BLUE (TOWEL DISPOSABLE) ×6 IMPLANT
TUBING CONNECTING 10 (TUBING) ×2 IMPLANT
TUBING CONNECTING 10' (TUBING) ×1

## 2018-04-01 NOTE — H&P (Signed)
Peter Grant is an 84 y.o. male.   Chief Complaint: Right leg numbness and weakness HPI:Peter Grant is here for evaluation of worsening leg weakness on the right. He was previously seen with concern for a L3 radiculopathy causing pain in the anterior thigh on the right. He states that the pain and numbness will sometimes go down into the knee and the medial side of the leg. He started to notice weakness with lifting his foot by extension of his leg. He has no other focal weakness. He denies any left leg symptoms. He has undergone multiple steroid injections and physical therapy but felt his symptoms were worsening.       Past Medical History:  Diagnosis Date  . Aortic stenosis   . Arthritis 2018   left hip and fingers  . Cancer ([place]) 2012   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  . CKD (chronic kidney disease) stage 3, GFR 30-59 ml/min (HCC)   . HLD (hyperlipidemia)   . Hypertension   . Hypothyroidism   . Sleep apnea    no cpap since he has not had a study    Past Surgical History:  Procedure Laterality Date  . EYE SURGERY Bilateral 2006   bilateral cataracts and bilateral blepharoplasty  . HERNIA REPAIR Left 2012   inguinal repair  . JOINT REPLACEMENT    . TONSILLECTOMY  1950  . TOTAL HIP ARTHROPLASTY Right 06/24/2017   Procedure: TOTAL HIP ARTHROPLASTY;  Surgeon: Dereck Leep, MD;  Location: [facility] ORS;  Service: Orthopedics;  Laterality: Right;    Family History  Problem Relation Age of Onset  . Dementia Mother   . Heart disease Father   . Cancer Sister   . Kidney failure Brother   . Heart failure Other    Social History:  reports that he has never smoked. He has never used smokeless tobacco. He reports that he drinks alcohol. He reports that he does not use drugs.  Allergies: No Known Allergies  Medications Prior to Admission  Medication Sig Dispense Refill  . amLODipine (NORVASC) 10 MG tablet Take 10 mg by mouth daily.    Marland Kitchen FIBER PO Take 2  capsules by mouth daily.    . hydrochlorothiazide (HYDRODIURIL) 25 MG tablet Take 25 mg by mouth daily.    Marland Kitchen levothyroxine (SYNTHROID, LEVOTHROID) 50 MCG tablet Take 50 mcg by mouth daily before breakfast.     . potassium chloride (K-DUR) 10 MEQ tablet Take 10 mEq by mouth daily.    . vitamin B-12 (CYANOCOBALAMIN) 1000 MCG tablet Take 1,000 mcg by mouth daily.    Marland Kitchen aspirin EC 81 MG tablet Take 81 mg by mouth daily.      No results found for this or any previous visit (from the past 48 hour(s)). No results found.  ROS General ROS: Negative Psychological ROS: Negative Ophthalmic ROS: Negative ENT ROS: Negative Hematological and Lymphatic ROS: Negative  Endocrine ROS: Negative Respiratory ROS: Negative Cardiovascular ROS: Negative Gastrointestinal ROS: Negative Genito-Urinary ROS: Negative Musculoskeletal ROS: Negative Neurological ROS: Other for leg pain and weakness Dermatological ROS: Negative   Blood pressure (!) 161/91, pulse 80, temperature (!) 96.8 F (36 C), temperature source Oral, resp. rate 16, height 5\' 8"  (1.727 m), weight 80.9 kg (178 lb 4 oz), SpO2 100 %. Physical Exam  General appearance: Alert, cooperative, in no acute distress Head: Normocephalic, atraumatic Eyes: Normal, EOM intact Oropharynx: Moist without lesions Back: No tenderness Pulm: Clear to auscultation CV: Regular rate and  rhythm Ext: No edema in LE bilaterally, warm extremities  Neurologic exam:  Mental status: alertness: alert, affect: normal Speech: fluent and clear Motor:strength symmetric 5/5 in bilateral lower extremities except for knee extension on the right which is 3 out of 5 Sensory: decreased over medial right leg Gait: normal     Assessment/Plan Plan is for L2/3 laminectomies today for decompression  Deetta Perla, MD 04/01/2018, 11:56 AM

## 2018-04-01 NOTE — Anesthesia Post-op Follow-up Note (Signed)
Anesthesia QCDR form completed.        

## 2018-04-01 NOTE — Op Note (Addendum)
SURGERY DATE: 04/01/2018  PRE-OP DIAGNOSIS: Lumbar Stenosis with Radiculopathy (m48.062)  POST-OP DIAGNOSIS:Post-Op Diagnosis Codes: Lumbar Stenosis with Radiculopathy (E28.003)  Procedure(s) with comments: L2 & L3 Lumbar laminectomy  SURGEON:  * Peter Gauze, MD   ANESTHESIA:General  OPERATIVE FINDINGS: Hypertrophied Ligament, Compression at L2/3 and L3/4  OPERATIVE REPORT:   Indication: Mr. Tildenpresented to the clinic on 7/23with ongoing leg numbness and weakness. He had failed conservative management including PT, steroid injections, and prescription medications. MRI revealed L2/3 and L3/4 stenosis from hypertrophied ligament. Therisks of surgery were explained to include hematoma, infection, damage to nerve roots, CSF leak, weakness, numbness, pain, need for future surgery including fusion, heart attack, and stroke. He elected to proceed with surgery for symptom relief.   Procedure The patient was brought to the OR after informed consent was obtained. He was given general anesthesia and intubated by the anesthesia service. Vascular access lines were placed.The patient was then placed prone on a Wilson frameensuring all pressure points were padded. A time-out was performed per protocol.   The patient was sterilely prepped and draped. Fluoroscopy confirmed L3 lamina and incision was planned.The incision was instilled withlocal anesthetic with epinephrine. The skin was opened sharply and the dissection taken to the fascia. This was incised and cautery was used to dissect the subperiosteal plane to expose the spinous processes and lamina of L2, L3, and L4. Dissection was continued laterally in order to expose the medial facets at the [place]. Retractors were inserted and hemostasis was achieved. X-ray was used to confirm location at the [place].  Next, a matchstickdrill bit was used to remove the L3lamina and L2 lamina centrally. The  superior portion of L4 was also removed. The underlying ligament was freed and removed with combination of rongeurs. The decompression was taken caudal to the superior border of L4 and inferior border of L2. Next attention was turned to the lateral stenosis and the ligamentwas freed from the medial facets using rongeurs. This continued along the left side to the level of the L4 pedicle. Care was taken not to injure the dura. Next, the right lateral recess was decompressed. This side had severe hypertrophied ligament which was removed. A curette was used to remove last remaining pieces. All exiting nerve roots were inspected with blunt probe and found to be free. No free fragment could be palpated in the foramen. The dura was seen to be full and all compression removed.  Xray again confirmed correct level.  Once the dura appeared free from all the bone edges and all soft tissue compression was removed, hemostasis was obtained with Floseal and cautery.The muscle was then closed using 0 vicryl followed by the fascia with 0 vicryl. Local anestheticwas injected into the muscle. Next, multiple subcutaneous and dermal layers were closed with 2-0 vicryluntil the epidermis was well approximated. The skin was closed with staples.  A dressing was applied.  The patient was returned to supine position and extubated by the anesthesia service. The patient was then taken to the PACU for post-operative care where he was moving extremities symmetrically.   ESTIMATED BLOOD LOSS: 100 cc  SPECIMENS None  IMPLANT None   I performed the case in its entirety  Deetta Perla, [place]

## 2018-04-01 NOTE — Anesthesia Postprocedure Evaluation (Signed)
Anesthesia Post Note  Patient: Peter Grant  Procedure(s) Performed: LUMBAR LAMINECTOMY/DECOMPRESSION MICRODISCECTOMY 1 LEVEL-L2-3 (N/A Back)  Patient location during evaluation: PACU Anesthesia Type: General Level of consciousness: awake and alert Pain management: pain level controlled Vital Signs Assessment: post-procedure vital signs reviewed and stable Respiratory status: spontaneous breathing, nonlabored ventilation, respiratory function stable and patient connected to nasal cannula oxygen Cardiovascular status: blood pressure returned to baseline and stable Postop Assessment: no apparent nausea or vomiting Anesthetic complications: no     Last Vitals:  Vitals:   04/01/18 1727 04/01/18 1736  BP: 104/60 (!) 108/53  Pulse: 68 70  Resp: 20 20  Temp: 36.8 C   SpO2: 96% 96%    Last Pain:  Vitals:   04/01/18 1727  TempSrc:   PainSc: Asleep                 Precious Haws Marche Hottenstein

## 2018-04-01 NOTE — Transfer of Care (Signed)
Immediate Anesthesia Transfer of Care Note  Patient: Peter Grant  Procedure(s) Performed: LUMBAR LAMINECTOMY/DECOMPRESSION MICRODISCECTOMY 1 LEVEL-L2-3 (N/A Back)  Patient Location: PACU  Anesthesia Type:General  Level of Consciousness: drowsy and responds to stimulation  Airway & Oxygen Therapy: Patient Spontanous Breathing and Patient connected to face mask oxygen  Post-op Assessment: Report given to RN and Post -op Vital signs reviewed and stable  Post vital signs: Reviewed and stable  Last Vitals:  Vitals Value Taken Time  BP 147/85 04/01/2018  4:27 PM  Temp    Pulse 64 04/01/2018  4:27 PM  Resp 15 04/01/2018  4:27 PM  SpO2 100 % 04/01/2018  4:27 PM  Vitals shown include unvalidated device data.  Last Pain:  Vitals:   04/01/18 1143  TempSrc: Oral         Complications: No apparent anesthesia complications

## 2018-04-01 NOTE — Interval H&P Note (Signed)
History and Physical Interval Note:  04/01/2018 11:58 AM  Peter Grant  has presented today for surgery, with the diagnosis of LUMLUMBAR RADICULOPATHY,SPINAL STENOSIS ,RIGHT LEG WEAKNESS  The various methods of treatment have been discussed with the patient and family. After consideration of risks, benefits and other options for treatment, the patient has consented to  Procedure(s): LUMBAR LAMINECTOMY/DECOMPRESSION MICRODISCECTOMY 1 LEVEL-L2-3 (N/A) as a surgical intervention .  The patient's history has been reviewed, patient examined, no change in status, stable for surgery.  I have reviewed the patient's chart and labs.  Questions were answered to the patient's satisfaction.     Deetta Perla

## 2018-04-01 NOTE — OR Nursing (Signed)
Dr. Lavone Neri aware of potassium level 3.1

## 2018-04-01 NOTE — Anesthesia Procedure Notes (Signed)
Procedure Name: Intubation Performed by: Alease Fait, CRNA Pre-anesthesia Checklist: Patient identified, Patient being monitored, Timeout performed, Emergency Drugs available and Suction available Patient Re-evaluated:Patient Re-evaluated prior to induction Oxygen Delivery Method: Circle system utilized Preoxygenation: Pre-oxygenation with 100% oxygen Induction Type: IV induction Ventilation: Mask ventilation without difficulty Laryngoscope Size: Mac and 3 Grade View: Grade I Tube type: Oral Tube size: 7.5 mm Number of attempts: 1 Airway Equipment and Method: Stylet Placement Confirmation: ETT inserted through vocal cords under direct vision,  positive ETCO2 and breath sounds checked- equal and bilateral Secured at: 23 cm Tube secured with: Tape Dental Injury: Teeth and Oropharynx as per pre-operative assessment        

## 2018-04-01 NOTE — Anesthesia Preprocedure Evaluation (Addendum)
Anesthesia Evaluation  Patient identified by MRN, date of birth, ID band Patient awake    Reviewed: Allergy & Precautions, H&P , NPO status , reviewed documented beta blocker date and time   Airway Mallampati: II  TM Distance: >3 FB Neck ROM: full    Dental  (+) Caps, Chipped   Pulmonary sleep apnea ,    Pulmonary exam normal        Cardiovascular hypertension, Normal cardiovascular exam  ECHO 09/2017  NORMAL LEFT VENTRICULAR SYSTOLIC FUNCTION NORMAL RIGHT VENTRICULAR SYSTOLIC FUNCTION MILD VALVULAR REGURGITATION (See above) MODERATE VALVULAR STENOSIS (See above) Unchanged aortic stenosis from 2017 study   Neuro/Psych    GI/Hepatic   Endo/Other  Hypothyroidism   Renal/GU Renal disease     Musculoskeletal  (+) Arthritis ,   Abdominal   Peds  Hematology   Anesthesia Other Findings Past Medical History: No date: Aortic stenosis - mild-mod per echo 09/2017, stable 2018: Arthritis     Comment:  left hip and fingers 2012: Cancer ([place])     Comment:  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 No date: CKD (chronic kidney disease) stage 3, GFR 30-59 ml/min (HCC) No date: HLD (hyperlipidemia) No date: Hypertension No date: Hypothyroidism No date: Sleep apnea     Comment:  no cpap since he has not had a study Past Surgical History: 2006: EYE SURGERY; Bilateral     Comment:  bilateral cataracts and bilateral blepharoplasty 2012: HERNIA REPAIR; Left     Comment:  inguinal repair No date: JOINT REPLACEMENT 1950: TONSILLECTOMY 06/24/2017: TOTAL HIP ARTHROPLASTY; Right     Comment:  Procedure: TOTAL HIP ARTHROPLASTY;  Surgeon: Dereck Leep, MD;  Location: [facility] ORS;  Service: Orthopedics;               Laterality: Right; BMI    Body Mass Index:  27.10 kg/m     Reproductive/Obstetrics                            Anesthesia Physical Anesthesia Plan  ASA:  III  Anesthesia Plan: General   Post-op Pain Management:    Induction: Intravenous  PONV Risk Score and Plan: 3 and Midazolam, Dexamethasone, Ondansetron and Metaclopromide  Airway Management Planned: Oral ETT  Additional Equipment:   Intra-op Plan:   Post-operative Plan: Extubation in OR  Informed Consent: I have reviewed the patients History and Physical, chart, labs and discussed the procedure including the risks, benefits and alternatives for the proposed anesthesia with the patient or authorized representative who has indicated his/her understanding and acceptance.   Dental Advisory Given  Plan Discussed with: CRNA  Anesthesia Plan Comments:         Anesthesia Quick Evaluation

## 2018-04-02 ENCOUNTER — Encounter: Payer: Self-pay | Admitting: *Deleted

## 2018-04-02 DIAGNOSIS — M48062 Spinal stenosis, lumbar region with neurogenic claudication: Secondary | ICD-10-CM | POA: Diagnosis not present

## 2018-04-02 MED ORDER — ACETAMINOPHEN 325 MG PO TABS: 650.0000 mg | ORAL_TABLET | Freq: Four times a day (QID) | ORAL | 0 refills | Status: AC | PRN

## 2018-04-02 MED ORDER — TRAMADOL HCL 50 MG PO TABS: 50.0000 mg | ORAL_TABLET | Freq: Four times a day (QID) | ORAL | 0 refills | Status: AC | PRN

## 2018-04-02 NOTE — Plan of Care (Signed)
  Problem: Health Behavior/Discharge Planning: Goal: Ability to manage health-related needs will improve Outcome: Progressing   Problem: Clinical Measurements: Goal: Ability to maintain clinical measurements within normal limits will improve Outcome: Progressing Goal: Will remain free from infection Outcome: Progressing   Problem: Activity: Goal: Risk for activity intolerance will decrease Outcome: Progressing   Problem: Coping: Goal: Level of anxiety will decrease Outcome: Progressing   Problem: Elimination: Goal: Will not experience complications related to bowel motility Outcome: Progressing   Problem: Pain Managment: Goal: General experience of comfort will improve Outcome: Progressing   Problem: Safety: Goal: Ability to remain free from injury will improve Outcome: Progressing   Problem: Skin Integrity: Goal: Risk for impaired skin integrity will decrease Outcome: Progressing

## 2018-04-02 NOTE — Discharge Summary (Signed)
Procedure: L2-3 lumbar decompression Procedure Date: 04/01/2018 Diagnosis: Lumbar radiculopathy, right leg weakness   History: Peter Grant is POD1 s/p L2/3 lumbar decompression for lumbar radiculopathy and progressive right lower extremity weakness. He is recovering well. Experiencing persistent but improving weakness in right lower quads, improving lower extremity numbness, and denies leg pain.  Back pain currently rated 7/10.    Physical Exam: Vitals:   04/02/18 0354 04/02/18 0745  BP: 118/67 131/79  Pulse: 72 68  Resp: 17 18  Temp: 98.1 F (36.7 C) 97.7 F (36.5 C)  SpO2: 96% 95%    AA Ox3 CNI Skin: honeycomb dressing over incision site. Mild bleeding on dressing. Staples present, incision intact Strength:5/5 throughout, except right quad 4-/5 Sensation: intact and symmetric  Data:  Recent Labs  Lab 03/29/18 1507 04/01/18 1207  NA 141 139  K 3.0* 3.1*  CL 98  --   CO2 32  --   BUN 26*  --   CREATININE 1.32*  --   GLUCOSE 100* 99  CALCIUM 9.7  --    No results for input(s): AST, ALT, ALKPHOS in the last 168 hours.  Invalid input(s): TBILI   Recent Labs  Lab 03/29/18 1507 04/01/18 1207  WBC 7.5  --   HGB 13.4 12.6*  HCT 38.7* 37.0*  PLT 243  --    Recent Labs  Lab 03/29/18 1507  APTT 27  INR 0.94         Other tests/results: No imaging reviewed.   Assessment/Plan:  Peter Grant is POD 1 status post L2-3 lumbar decompression for lumbar radiculopathy and progressive right leg weakness.  He is recovering well.  PT evaluation was satisfactory and patient is set up to receive home therapy postoperatively.  Incision site dressings changed.  Wound care and postop activity restrictions discussed with patient.  Week follow-up in clinic has already been scheduled.  We will continue pain control with Tylenol, ibuprofen, tramadol as needed.  Questions and concerns were addressed.  Patient advised to contact office if additional questions or concerns  arise.  Marin Olp PA-C Department of Neurosurgery

## 2018-04-02 NOTE — Evaluation (Signed)
Physical Therapy Evaluation Patient Details Name: Peter Grant MRN: [id number] DOB: April 11, 1931 Today's Date: 04/02/2018   History of Present Illness  84 y/o male s/p L3-4 laminectomy 8/1, R LE weakness pre and post sx, specifically quad/knee ext.  Clinical Impression  Pt did well with PT exam and additional gait training, mobility training and precaution and brace education.  He showed good confidence and safety with walker and even with less support but did have significant weakness in the R quad that effected his ambulation and more notedly with rising from low seat. Pt is safe to go home, has wife there to assist.   Follow Up Recommendations Home health PT;Follow surgeon's recommendation for DC plan and follow-up therapies    Equipment Recommendations  None recommended by PT    Recommendations for Other Services       Precautions / Restrictions Precautions Precautions: Back Precaution Booklet Issued: Yes (comment) Required Braces or Orthoses: Spinal Brace Spinal Brace: Lumbar corset Restrictions Weight Bearing Restrictions: No      Mobility  Bed Mobility Overal bed mobility: Modified Independent             General bed mobility comments: VCs to roll to side with appropriate neutral positioning (log roll)  Transfers Overall transfer level: Modified independent Equipment used: Rolling walker (2 wheeled)             General transfer comment: slightly elevated bed height, showed significant need for UE use to attain standing  Ambulation/Gait Ambulation/Gait assistance: Modified independent (Device/Increase time) Gait Distance (Feet): 250 Feet Assistive device: Rolling walker (2 wheeled);1 person hand held assist       General Gait Details: Pt did not have any overt limp or buckling on the R but did require more deliberate placement and awareness secondary to quad weakness, pt able to ambulate w/ single UE use on hallway rails/HHA but was safer and more stable  with RW  Stairs Stairs: Yes Stairs assistance: Modified independent (Device/Increase time) Stair Management: Two rails;Step to pattern;Forwards Number of Stairs: 4 General stair comments: Pt was able to safely negotiate up/down steps w/o excessive cuing, no physical assist  Wheelchair Mobility    Modified Rankin (Stroke Patients Only)       Balance Overall balance assessment: Independent                                           Pertinent Vitals/Pain Pain Assessment: 0-10 Pain Score: 7  Pain Location: surgical site    Home Living Family/patient expects to be discharged to:: Private residence Living Arrangements: Spouse/significant other Available Help at Discharge: Family Type of Home: House Home Access: Stairs to enter Entrance Stairs-Rails: Can reach both Entrance Stairs-Number of Steps: 3 Home Layout: Two level;Able to live on main level with bedroom/bathroom Home Equipment: Kasandra Knudsen - single point;Shower seat - built in;Grab bars - toilet;Grab bars - tub/shower;Hand held shower head;Adaptive equipment      Prior Function Level of Independence: Independent         Comments: Independent with all ADLs; active with golf, yard work and other projects around home     [place]: Right    Extremity/Trunk Assessment   Upper Extremity Assessment Upper Extremity Assessment: Overall WFL for tasks assessed(limited heavy resistance secondary to sx)    Lower Extremity Assessment Lower Extremity Assessment: Overall WFL for tasks assessed(except R quad strength  highly limited, 3-/5)       Communication   Communication: No difficulties  Cognition Arousal/Alertness: Awake/alert Behavior During Therapy: WFL for tasks assessed/performed Overall Cognitive Status: Within Functional Limits for tasks assessed                                        General Comments      Exercises     Assessment/Plan    PT  Assessment Patient needs continued PT services  PT Problem List Decreased strength;Decreased activity tolerance;Decreased balance;Decreased mobility;Decreased knowledge of use of DME;Decreased safety awareness;Decreased knowledge of precautions;Pain       PT Treatment Interventions DME instruction;Gait training;Functional mobility training;Stair training;Therapeutic activities;Therapeutic exercise;Balance training;Neuromuscular re-education;Patient/family education    PT Goals (Current goals can be found in the Care Plan section)  Acute Rehab PT Goals Patient Stated Goal: get back to his normal activities PT Goal Formulation: With patient Time For Goal Achievement: 04/16/18 Potential to Achieve Goals: Good    Frequency 7X/week   Barriers to discharge        Co-evaluation               AM-PAC PT "6 Clicks" Daily Activity  Outcome Measure Difficulty turning over in bed (including adjusting bedclothes, sheets and blankets)?: A Little Difficulty moving from lying on back to sitting on the side of the bed? : A Little Difficulty sitting down on and standing up from a chair with arms (e.g., wheelchair, bedside commode, etc,.)?: A Lot Help needed moving to and from a bed to chair (including a wheelchair)?: None Help needed walking in hospital room?: None Help needed climbing 3-5 steps with a railing? : None 6 Click Score: 20    End of Session Equipment Utilized During Treatment: Back brace Activity Tolerance: Patient tolerated treatment well Patient left: with call bell/phone within reach;with chair alarm set Nurse Communication: Mobility status PT Visit Diagnosis: Muscle weakness (generalized) (M62.81);Other (comment);Other abnormalities of gait and mobility (R26.89)(lumbago)    Time: 2122-4825 PT Time Calculation (min) (ACUTE ONLY): 47 min   Charges:   PT Evaluation $PT Eval Low Complexity: 1 Low PT Treatments $Gait Training: 8-22 mins $Therapeutic Activity: 8-22  mins        Kreg Shropshire, DPT 04/02/2018, 11:26 AM

## 2018-04-02 NOTE — Progress Notes (Signed)
Patient is being discharged to home this afternoon. DC & RX instructions given and patient acknowledged understanding. NT removed IV and helped patient dress for transport home with family member.

## 2018-04-02 NOTE — Discharge Instructions (Signed)
Your surgeon has performed an operation on your lumbar spine (low back) to relieve pressure on one or more nerves. Many times, patients feel better immediately after surgery and can overdo it. Even if you feel well, it is important that you follow these activity guidelines. If you do not let your back heal properly from the surgery, you can increase the chance of a disc herniation and/or return of your symptoms. The following are instructions to help in your recovery once you have been discharged from the hospital.  * Do not take anti-inflammatory medications for 3 days after surgery (naproxen [Aleve], ibuprofen [Advil, Motrin], celecoxib [Celebrex], etc.) * Continue aspirin after 7 days  Activity    No bending, lifting, or twisting (BLT). Avoid lifting objects heavier than 10 pounds (gallon milk jug).  Where possible, avoid household activities that involve lifting, bending, pushing, or pulling such as laundry, vacuuming, grocery shopping, and childcare. Try to arrange for help from friends and family for these activities while your back heals.  Increase physical activity slowly as tolerated.  Taking short walks is encouraged, but avoid strenuous exercise. Do not jog, run, bicycle, lift weights, or participate in any other exercises unless specifically allowed by your doctor. Avoid prolonged sitting, including car rides.  Talk to your doctor before resuming sexual activity.  You should not drive until cleared by your doctor.  Until released by your doctor, you should not return to work or school.  You should rest at home and let your body heal.   You may shower two days after your surgery.  After showering, lightly dab your incision dry. Do not take a tub bath or go swimming for 3 weeks, or until approved by your doctor at your follow-up appointment.  If you smoke, we strongly recommend that you quit.  Smoking has been proven to interfere with normal healing in your back and will  dramatically reduce the success rate of your surgery. Please contact [facility] (800-QUIT-NOW) and use the resources at www.[facility].com for assistance in stopping smoking.  Surgical Incision   If you have a dressing on your incision, you may remove it three days after your surgery. Keep your incision area clean and dry.  If you have staples or stitches on your incision, you should have a follow up scheduled for removal. If you do not have staples or stitches, you will have steri-strips (small pieces of surgical tape) or Dermabond glue. The steri-strips/glue should begin to peel away within about a week (it is fine if the steri-strips fall off before then). If the strips are still in place one week after your surgery, you may gently remove them.  Diet            You may return to your usual diet. Be sure to stay hydrated.  When to Contact us  Although your surgery and recovery will likely be uneventful, you may have some residual numbness, aches, and pains in your back and/or legs. This is normal and should improve in the next few weeks.  However, should you experience any of the following, contact us immediately:  New numbness or weakness  Pain that is progressively getting worse, and is not relieved by your pain medications or rest  Bleeding, redness, swelling, pain, or drainage from surgical incision  Chills or flu-like symptoms  Fever greater than 101.0 F (38.3 C)  Problems with bowel or bladder functions  Difficulty breathing or shortness of breath  Warmth, tenderness, or swelling in your calf  Contact  Information  During office hours (Monday-Friday 9 am to 5 pm), please call your physician at [phone]  After hours and weekends, please call the [facility] Operator at [phone] and ask for the Neurosurgery Resident On Call   For a life-threatening emergency, call 911

## 2019-07-26 IMAGING — MR MR LUMBAR SPINE W/O CM
4 of 5 series · 25 of 48 positions shown · non-contrast
Comparison: 01/13/2018

CLINICAL DATA: Right leg pain, tingling, and weakness. Symptoms
began after riding on a golf cart on 12/29/2017. Worsening symptoms
despite physical therapy.

EXAM:
MRI LUMBAR SPINE WITHOUT CONTRAST
TECHNIQUE: Multiplanar, multisequence MR imaging of the lumbar spine was
performed. No intravenous contrast was administered.

[Series 2: T2 · sagittal · 4.0mm · 0.81mm/px · 6 of 15 slices shown (1 of 2)]
[im 1/15]
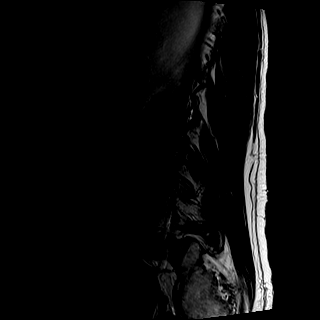
[im 3/15]
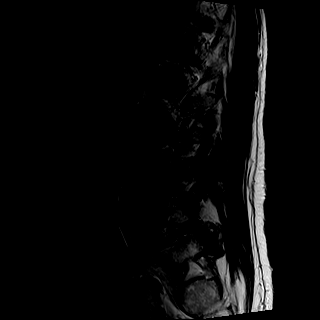
[im 6/15]
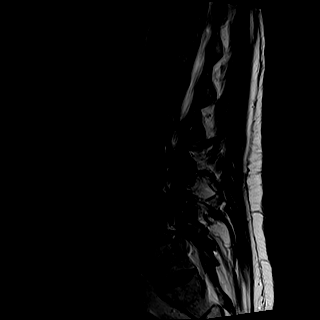
[im 9/15]
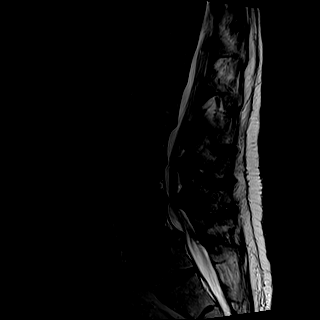
[im 12/15]
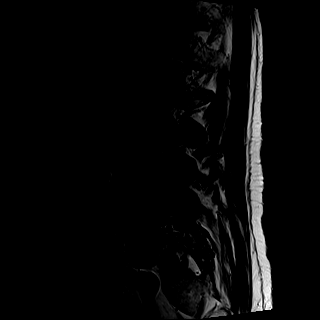
[im 15/15]
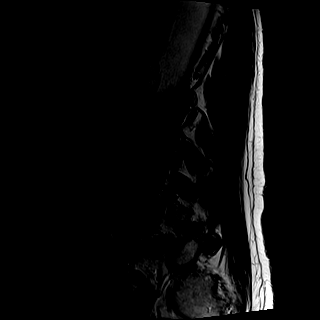

[Series 3: T1 · sagittal · 4.0mm · 0.41mm/px · 6 of 15 slices shown (1 of 2)]
[im 1/15]
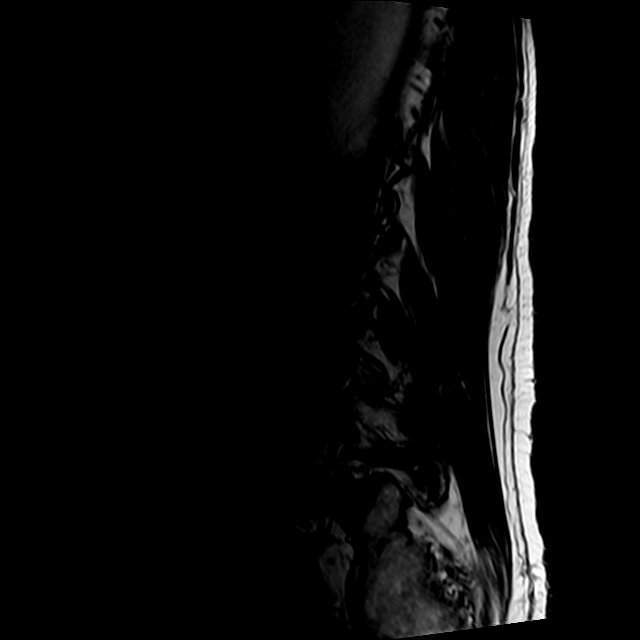
[im 3/15]
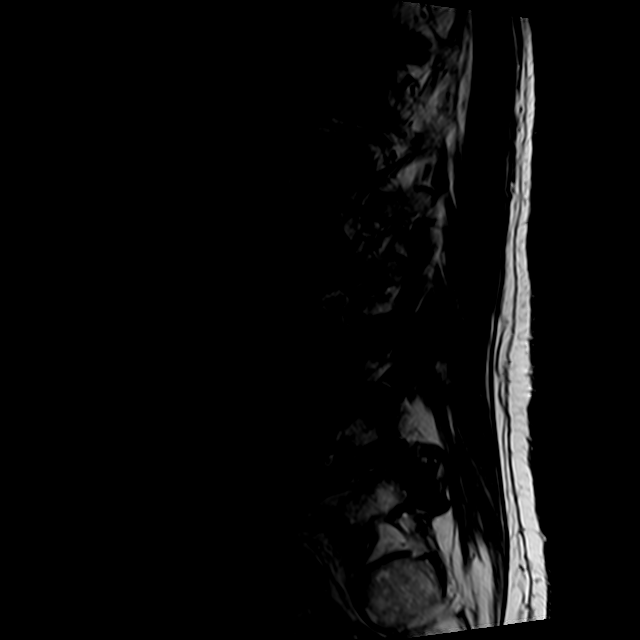
[im 6/15]
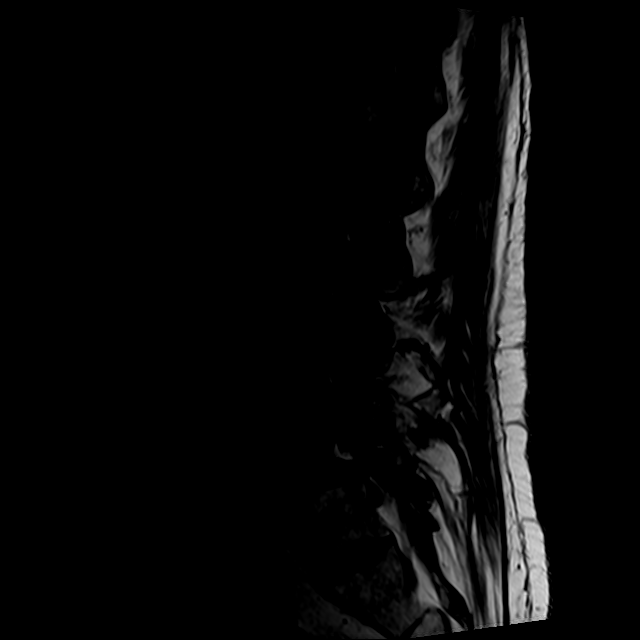
[im 9/15]
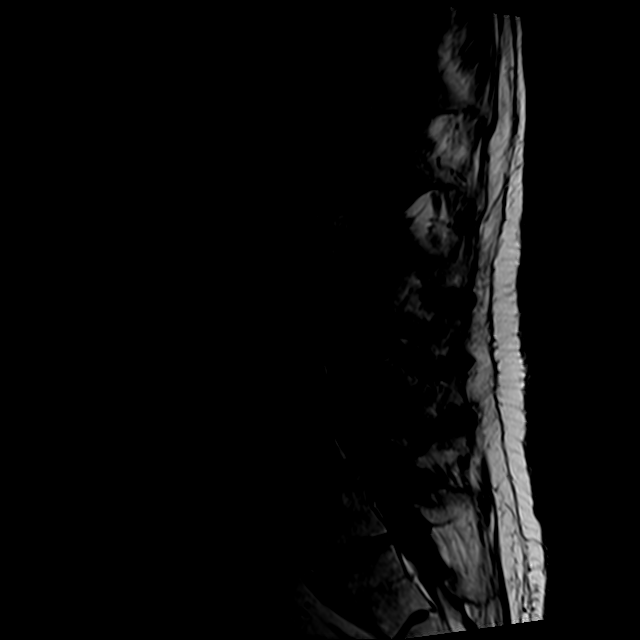
[im 12/15]
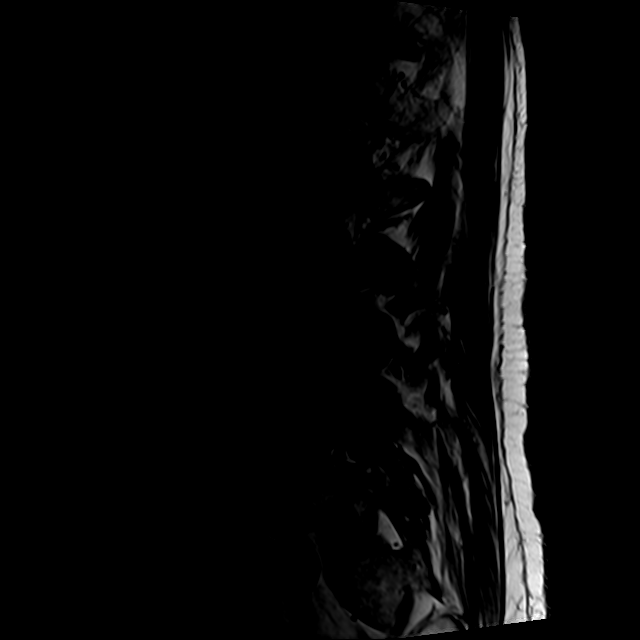
[im 15/15]
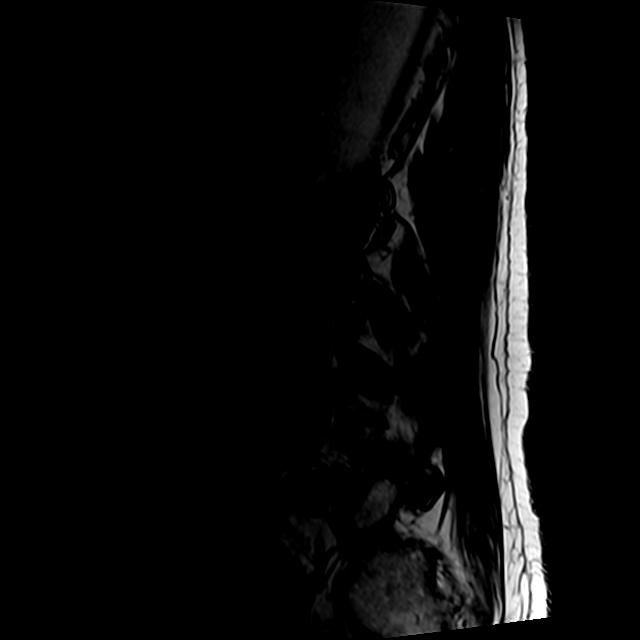

[Series 5: T2 · axial · 4.0mm · 0.78mm/px · z∈[-121,+99]mm · 9 of 37 slices shown (2 of 2)]
[im 1/37]
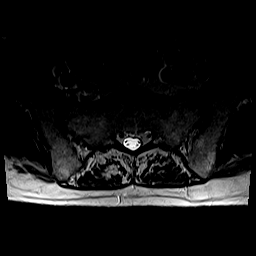
[im 6/37]
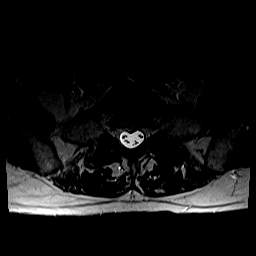
[im 11/37]
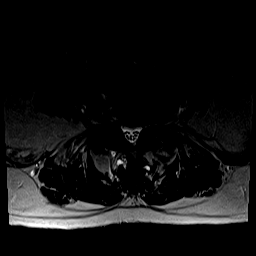
[im 16/37]
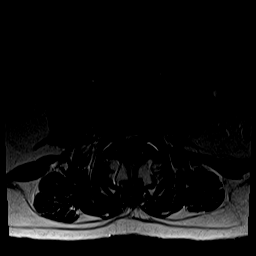
[im 19/37]
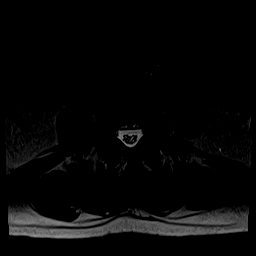
[im 21/37]
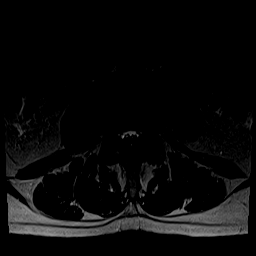
[im 26/37]
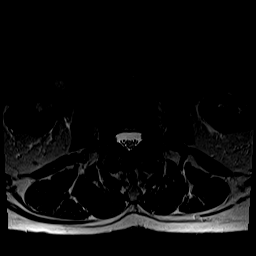
[im 31/37]
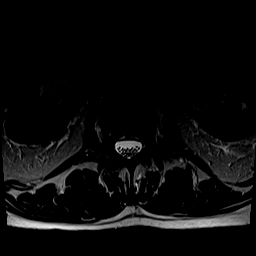
[im 37/37]
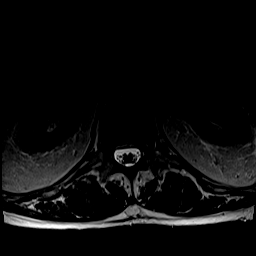

[Series 6: T1 · axial · 4.0mm · 0.39mm/px · z∈[-121,+69]mm · 4 of 37 slices shown (2 of 2)]
[im 1/37]
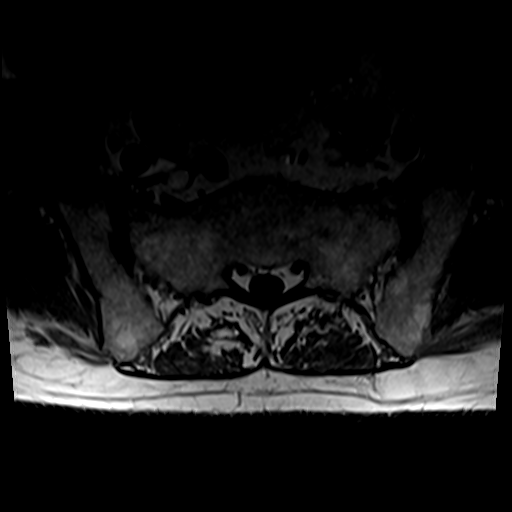
[im 6/37]
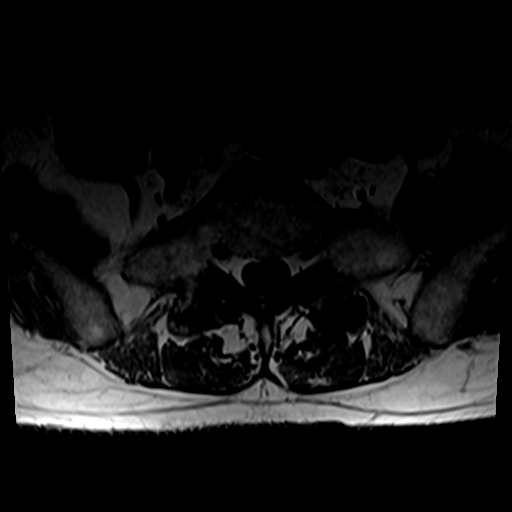
[im 19/37]
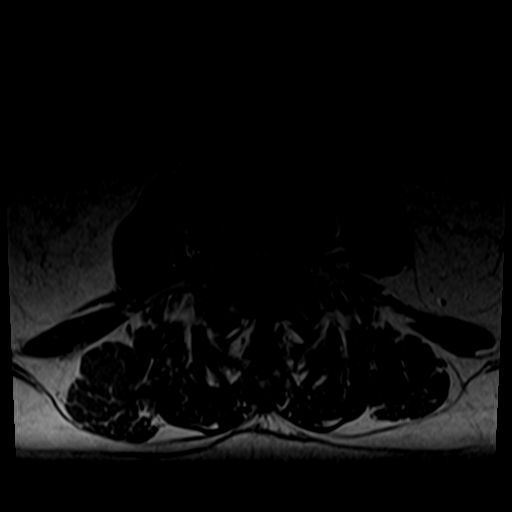
[im 31/37]
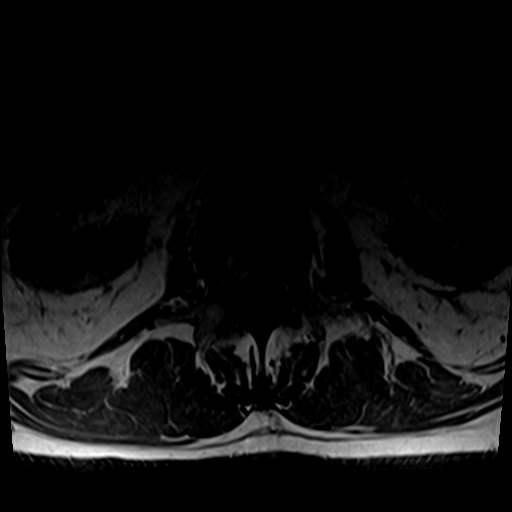

[25 of 48 positions shown; findings below may reference images not displayed]

FINDINGS: Segmentation: Transitional lumbosacral anatomy with partial
sacralization of L5 and mildly hypoplastic L5-S1 disc. This is the
same numbering that was used on the prior study.

Alignment: Unchanged alignment including trace retrolisthesis of L1
on L2 and 9 mm anterolisthesis of L4 on L5.

Vertebrae: No fracture, suspicious osseous lesion, or significant
marrow edema. Mild chronic degenerative endplate changes throughout
the lumbar spine.

Conus medullaris and cauda equina: Conus extends to the T12-L1
level. Conus and cauda equina appear normal.

Paraspinal and other soft tissues: Right extra renal pelvis.

Disc levels:

Disc desiccation throughout the lumbar spine with exception of
L5-S1. Severe disc space narrowing at L4-5 with mild narrowing at
L1-2.

T11-12: Only imaged sagittally. Mild right and mild-to-moderate left
facet hypertrophy without evidence of significant stenosis,
unchanged.

T12-L1: Negative.

L1-2: Retrolisthesis, circumferential disc bulging, and mild facet
and ligamentum flavum hypertrophy result in mild spinal stenosis,
mild bilateral lateral recess stenosis, and minimal bilateral neural
foraminal stenosis, unchanged.

L2-3: Circumferential disc bulging and moderate facet and ligamentum
flavum hypertrophy result in moderate spinal stenosis, moderate
bilateral lateral recess stenosis, and mild to moderate left greater
than right neural foraminal stenosis, unchanged.

L3-4: Circumferential disc bulging and severe facet and ligamentum
flavum hypertrophy result in severe spinal stenosis, severe
bilateral lateral recess stenosis, and severe left neural foraminal
stenosis, unchanged. Additionally, there is a right foraminal disc
extrusion which is either new or larger than on the prior study and
contributes to increased, severe spinal stenosis and likely right L3
nerve root impingement. There are small facet joint effusions.

L4-5: Anterolisthesis with disc uncovering, severe disc space
narrowing, ligamentum flavum hypertrophy, and severe facet arthrosis
result in moderate to severe spinal stenosis, severe right and
moderate left lateral recess stenosis, and moderate to severe right
and moderate left neural foraminal stenosis, unchanged.

L5-S1: Transitional level.  No stenosis.
IMPRESSION: 1. Transitional lumbosacral anatomy as above.
2. The symptomatic level is favored to be L3-4 where a new or larger
right foraminal disc extrusion contributes to severe foraminal
stenosis and L3 (or potentially L4 given transitional anatomy) nerve
root impingement.
3. Otherwise unchanged disc and facet degeneration with moderate to
severe multilevel spinal and neural foraminal stenosis as above.

## 2019-08-03 IMAGING — RF DG LUMBAR SPINE 2-3V
1 series · 1 of 1 positions shown · non-contrast
Comparison: Lumbar spine MRI on 03/24/2018

CLINICAL DATA: Lumbar laminectomy L2-3.

EXAM:
DG C-ARM 61-120 MIN; LUMBAR SPINE - 2-3 VIEW

[Series 1: run · 1 of 1 slices shown]
[im 1/1]
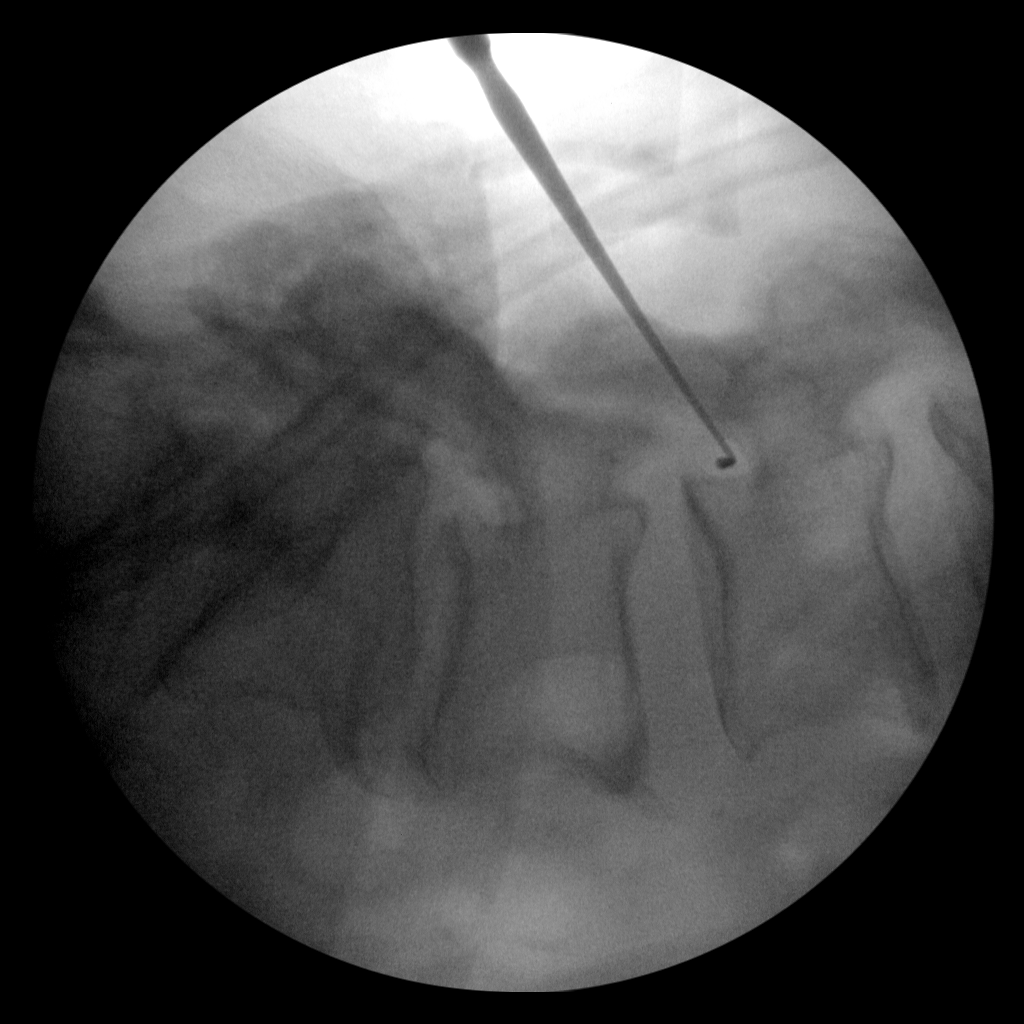

[1 of 1 positions shown; findings below may reference images not displayed]

FINDINGS: Single intraoperative image is submitted, demonstrating a surgical
instrument posterior to the L3 vertebral body. Numbering is based on
previous MRI studies.
IMPRESSION: Intraoperative localization.

## 2020-10-02 LAB — SURGICAL PATHOLOGY
